# Patient Record
Sex: Female | Born: 1975 | Race: Black or African American | Hispanic: No | Marital: Married | Smoking: Former smoker
Health system: Southern US, Community
[De-identification: ages and names within clinical notes are randomized; demographics above are authoritative.]

## PROBLEM LIST (undated history)

## (undated) ENCOUNTER — Emergency Department (HOSPITAL_BASED_OUTPATIENT_CLINIC_OR_DEPARTMENT_OTHER): Admission: EM | Payer: Self-pay | Source: Ambulatory Visit

## (undated) DIAGNOSIS — R102 Pelvic and perineal pain: Secondary | ICD-10-CM

## (undated) DIAGNOSIS — D649 Anemia, unspecified: Secondary | ICD-10-CM

## (undated) DIAGNOSIS — Z789 Other specified health status: Secondary | ICD-10-CM

## (undated) HISTORY — DX: Pelvic and perineal pain: R10.2

## (undated) HISTORY — PX: TUBAL LIGATION: SHX77

## (undated) HISTORY — PX: ENDOMETRIAL ABLATION: SHX621

## (undated) HISTORY — PX: WISDOM TOOTH EXTRACTION: SHX21

---

## 2001-05-27 ENCOUNTER — Emergency Department (HOSPITAL_COMMUNITY): Admission: EM | Admit: 2001-05-27 | Discharge: 2001-05-27 | Payer: Self-pay | Admitting: Emergency Medicine

## 2001-07-04 ENCOUNTER — Other Ambulatory Visit: Admission: RE | Admit: 2001-07-04 | Discharge: 2001-07-04 | Payer: Self-pay | Admitting: Family Medicine

## 2001-08-16 ENCOUNTER — Emergency Department (HOSPITAL_COMMUNITY): Admission: EM | Admit: 2001-08-16 | Discharge: 2001-08-16 | Payer: Self-pay | Admitting: Emergency Medicine

## 2001-08-16 ENCOUNTER — Encounter: Payer: Self-pay | Admitting: Emergency Medicine

## 2001-10-30 ENCOUNTER — Encounter: Payer: Self-pay | Admitting: Family Medicine

## 2001-10-30 ENCOUNTER — Ambulatory Visit (HOSPITAL_COMMUNITY): Admission: RE | Admit: 2001-10-30 | Discharge: 2001-10-30 | Payer: Self-pay | Admitting: Family Medicine

## 2002-01-29 ENCOUNTER — Emergency Department (HOSPITAL_COMMUNITY): Admission: EM | Admit: 2002-01-29 | Discharge: 2002-01-29 | Payer: Self-pay | Admitting: Emergency Medicine

## 2002-02-06 ENCOUNTER — Encounter: Payer: Self-pay | Admitting: *Deleted

## 2002-02-06 ENCOUNTER — Emergency Department (HOSPITAL_COMMUNITY): Admission: EM | Admit: 2002-02-06 | Discharge: 2002-02-06 | Payer: Self-pay | Admitting: *Deleted

## 2002-05-22 ENCOUNTER — Ambulatory Visit (HOSPITAL_COMMUNITY): Admission: RE | Admit: 2002-05-22 | Discharge: 2002-05-22 | Payer: Self-pay | Admitting: Family Medicine

## 2002-05-22 ENCOUNTER — Encounter: Payer: Self-pay | Admitting: Orthopaedic Surgery

## 2002-08-03 ENCOUNTER — Encounter: Payer: Self-pay | Admitting: Emergency Medicine

## 2002-08-03 ENCOUNTER — Encounter: Payer: Self-pay | Admitting: Cardiology

## 2002-08-03 ENCOUNTER — Encounter: Payer: Self-pay | Admitting: Internal Medicine

## 2002-08-03 ENCOUNTER — Observation Stay (HOSPITAL_COMMUNITY): Admission: EM | Admit: 2002-08-03 | Discharge: 2002-08-05 | Payer: Self-pay | Admitting: Emergency Medicine

## 2002-11-25 ENCOUNTER — Encounter: Payer: Self-pay | Admitting: Family Medicine

## 2002-11-25 ENCOUNTER — Ambulatory Visit (HOSPITAL_COMMUNITY): Admission: RE | Admit: 2002-11-25 | Discharge: 2002-11-25 | Payer: Self-pay | Admitting: Family Medicine

## 2003-02-02 ENCOUNTER — Emergency Department (HOSPITAL_COMMUNITY): Admission: EM | Admit: 2003-02-02 | Discharge: 2003-02-02 | Payer: Self-pay | Admitting: *Deleted

## 2003-02-02 ENCOUNTER — Encounter: Admission: RE | Admit: 2003-02-02 | Discharge: 2003-05-03 | Payer: Self-pay | Admitting: Family Medicine

## 2003-02-02 ENCOUNTER — Encounter: Payer: Self-pay | Admitting: *Deleted

## 2003-02-05 ENCOUNTER — Inpatient Hospital Stay (HOSPITAL_COMMUNITY): Admission: AD | Admit: 2003-02-05 | Discharge: 2003-02-05 | Payer: Self-pay | Admitting: Obstetrics and Gynecology

## 2003-02-08 ENCOUNTER — Encounter: Payer: Self-pay | Admitting: *Deleted

## 2003-02-08 ENCOUNTER — Ambulatory Visit (HOSPITAL_COMMUNITY): Admission: RE | Admit: 2003-02-08 | Discharge: 2003-02-08 | Payer: Self-pay | Admitting: *Deleted

## 2003-03-06 ENCOUNTER — Inpatient Hospital Stay (HOSPITAL_COMMUNITY): Admission: AD | Admit: 2003-03-06 | Discharge: 2003-03-06 | Payer: Self-pay | Admitting: Obstetrics and Gynecology

## 2003-03-07 ENCOUNTER — Inpatient Hospital Stay (HOSPITAL_COMMUNITY): Admission: AD | Admit: 2003-03-07 | Discharge: 2003-03-07 | Payer: Self-pay | Admitting: Obstetrics and Gynecology

## 2003-08-12 ENCOUNTER — Ambulatory Visit (HOSPITAL_COMMUNITY): Admission: RE | Admit: 2003-08-12 | Discharge: 2003-08-12 | Payer: Self-pay | Admitting: Obstetrics and Gynecology

## 2003-08-12 ENCOUNTER — Encounter (INDEPENDENT_AMBULATORY_CARE_PROVIDER_SITE_OTHER): Payer: Self-pay | Admitting: Specialist

## 2003-08-31 ENCOUNTER — Emergency Department (HOSPITAL_COMMUNITY): Admission: EM | Admit: 2003-08-31 | Discharge: 2003-09-01 | Payer: Self-pay | Admitting: Emergency Medicine

## 2003-09-27 ENCOUNTER — Emergency Department (HOSPITAL_COMMUNITY): Admission: EM | Admit: 2003-09-27 | Discharge: 2003-09-27 | Payer: Self-pay | Admitting: Emergency Medicine

## 2003-09-27 ENCOUNTER — Encounter: Payer: Self-pay | Admitting: Emergency Medicine

## 2003-10-18 ENCOUNTER — Encounter: Payer: Self-pay | Admitting: Emergency Medicine

## 2003-10-18 ENCOUNTER — Emergency Department (HOSPITAL_COMMUNITY): Admission: EM | Admit: 2003-10-18 | Discharge: 2003-10-18 | Payer: Self-pay | Admitting: Emergency Medicine

## 2003-10-20 ENCOUNTER — Other Ambulatory Visit: Admission: RE | Admit: 2003-10-20 | Discharge: 2003-10-20 | Payer: Self-pay | Admitting: Obstetrics and Gynecology

## 2003-11-21 ENCOUNTER — Emergency Department (HOSPITAL_COMMUNITY): Admission: EM | Admit: 2003-11-21 | Discharge: 2003-11-21 | Payer: Self-pay | Admitting: *Deleted

## 2003-11-26 ENCOUNTER — Ambulatory Visit (HOSPITAL_COMMUNITY): Admission: RE | Admit: 2003-11-26 | Discharge: 2003-11-26 | Payer: Self-pay | Admitting: Orthopaedic Surgery

## 2004-01-14 ENCOUNTER — Ambulatory Visit (HOSPITAL_COMMUNITY): Admission: RE | Admit: 2004-01-14 | Discharge: 2004-01-14 | Payer: Self-pay | Admitting: Family Medicine

## 2004-04-22 ENCOUNTER — Emergency Department (HOSPITAL_COMMUNITY): Admission: EM | Admit: 2004-04-22 | Discharge: 2004-04-22 | Payer: Self-pay | Admitting: Emergency Medicine

## 2005-02-02 ENCOUNTER — Inpatient Hospital Stay (HOSPITAL_COMMUNITY): Admission: EM | Admit: 2005-02-02 | Discharge: 2005-02-05 | Payer: Self-pay | Admitting: *Deleted

## 2006-04-19 ENCOUNTER — Emergency Department (HOSPITAL_COMMUNITY): Admission: EM | Admit: 2006-04-19 | Discharge: 2006-04-19 | Payer: Self-pay | Admitting: Emergency Medicine

## 2006-05-02 ENCOUNTER — Emergency Department (HOSPITAL_COMMUNITY): Admission: EM | Admit: 2006-05-02 | Discharge: 2006-05-02 | Payer: Self-pay | Admitting: Family Medicine

## 2006-12-04 ENCOUNTER — Inpatient Hospital Stay (HOSPITAL_COMMUNITY): Admission: AD | Admit: 2006-12-04 | Discharge: 2006-12-04 | Payer: Self-pay | Admitting: Gynecology

## 2007-09-19 ENCOUNTER — Emergency Department (HOSPITAL_COMMUNITY): Admission: EM | Admit: 2007-09-19 | Discharge: 2007-09-19 | Payer: Self-pay | Admitting: Family Medicine

## 2008-11-14 ENCOUNTER — Emergency Department (HOSPITAL_COMMUNITY): Admission: EM | Admit: 2008-11-14 | Discharge: 2008-11-15 | Payer: Self-pay | Admitting: Emergency Medicine

## 2008-12-28 ENCOUNTER — Emergency Department (HOSPITAL_COMMUNITY): Admission: EM | Admit: 2008-12-28 | Discharge: 2008-12-28 | Payer: Self-pay | Admitting: Family Medicine

## 2009-10-27 ENCOUNTER — Encounter: Payer: Self-pay | Admitting: Family Medicine

## 2009-12-07 ENCOUNTER — Emergency Department (HOSPITAL_COMMUNITY): Admission: EM | Admit: 2009-12-07 | Discharge: 2009-12-07 | Payer: Self-pay | Admitting: Family Medicine

## 2010-05-03 ENCOUNTER — Observation Stay (HOSPITAL_COMMUNITY): Admission: EM | Admit: 2010-05-03 | Discharge: 2010-05-04 | Payer: Self-pay | Admitting: Emergency Medicine

## 2010-05-03 ENCOUNTER — Ambulatory Visit: Payer: Self-pay | Admitting: Internal Medicine

## 2010-05-04 ENCOUNTER — Encounter (INDEPENDENT_AMBULATORY_CARE_PROVIDER_SITE_OTHER): Payer: Self-pay | Admitting: Emergency Medicine

## 2010-12-29 ENCOUNTER — Emergency Department (HOSPITAL_COMMUNITY)
Admission: EM | Admit: 2010-12-29 | Discharge: 2010-12-29 | Payer: Self-pay | Source: Home / Self Care | Admitting: Emergency Medicine

## 2011-01-23 ENCOUNTER — Emergency Department (HOSPITAL_COMMUNITY): Admission: EM | Admit: 2011-01-23 | Discharge: 2011-01-23 | Payer: Self-pay | Source: Home / Self Care

## 2011-01-23 ENCOUNTER — Emergency Department (HOSPITAL_BASED_OUTPATIENT_CLINIC_OR_DEPARTMENT_OTHER)
Admission: EM | Admit: 2011-01-23 | Discharge: 2011-01-23 | Payer: Self-pay | Source: Home / Self Care | Admitting: Emergency Medicine

## 2011-01-23 ENCOUNTER — Other Ambulatory Visit: Payer: Self-pay | Admitting: Family Medicine

## 2011-01-23 ENCOUNTER — Emergency Department (HOSPITAL_COMMUNITY)
Admission: EM | Admit: 2011-01-23 | Discharge: 2011-01-23 | Payer: Self-pay | Source: Home / Self Care | Admitting: Emergency Medicine

## 2011-01-23 ENCOUNTER — Emergency Department (HOSPITAL_BASED_OUTPATIENT_CLINIC_OR_DEPARTMENT_OTHER)
Admission: EM | Admit: 2011-01-23 | Discharge: 2011-01-24 | Disposition: A | Discharge status: Home / Self Care | Payer: Self-pay | Attending: Emergency Medicine | Admitting: Emergency Medicine

## 2011-01-23 DIAGNOSIS — K8020 Calculus of gallbladder without cholecystitis without obstruction: Secondary | ICD-10-CM

## 2011-01-23 LAB — COMPREHENSIVE METABOLIC PANEL
AST: 19 U/L (ref 0–37)
Alkaline Phosphatase: 56 U/L (ref 39–117)
Alkaline Phosphatase: 81 U/L (ref 39–117)
BUN: 11 mg/dL (ref 6–23)
BUN: 14 mg/dL (ref 6–23)
Chloride: 107 mEq/L (ref 96–112)
GFR calc Af Amer: >60 mL/min (ref >60)
GFR calc Af Amer: >60 mL/min (ref >60)
Sodium: 142 mEq/L (ref 135–145)
Total Bilirubin: 0.4 mg/dL (ref 0.3–1.2)
Total Protein: 7.4 g/dL (ref 6.0–8.3)
Total Protein: 8.4 g/dL — ABNORMAL HIGH (ref 6.0–8.3)

## 2011-01-23 LAB — DIFFERENTIAL
Eosinophils Relative: 4 % (ref 0–5)
Lymphocytes Relative: 51 % — ABNORMAL HIGH (ref 12–46)
Monocytes Relative: 10 % (ref 3–12)
Neutrophils Relative %: 35 % — ABNORMAL LOW (ref 43–77)

## 2011-01-23 LAB — LIPASE, BLOOD
Lipase: 32 U/L (ref 11–59)
Lipase: 120 U/L (ref 23–300)

## 2011-01-23 LAB — URINE MICROSCOPIC-ADD ON

## 2011-01-23 LAB — CBC
HCT: 30.8 % — ABNORMAL LOW (ref 36.0–46.0)
HCT: 30.7 % — ABNORMAL LOW (ref 36.0–46.0)
Hemoglobin: 9.3 g/dL — ABNORMAL LOW (ref 12.0–15.0)
MCH: 21.1 pg — ABNORMAL LOW (ref 26.0–34.0)
MCH: 21.1 pg — ABNORMAL LOW (ref 26.0–34.0)
MCHC: 30.2 g/dL (ref 30.0–36.0)
MCV: 70 fL — ABNORMAL LOW (ref 78.0–100.0)
MCV: 67.3 fL — ABNORMAL LOW (ref 78.0–100.0)
Platelets: 280 10*3/uL (ref 150–400)
Platelets: 299 10*3/uL (ref 150–400)
RBC: 4.4 MIL/uL (ref 3.87–5.11)
RDW: 17.6 % — ABNORMAL HIGH (ref 11.5–15.5)
WBC: 7.6 10*3/uL (ref 4.0–10.5)
WBC: 8.4 10*3/uL (ref 4.0–10.5)

## 2011-01-23 LAB — URINALYSIS, ROUTINE W REFLEX MICROSCOPIC
Hgb urine dipstick: NEGATIVE
Ketones, ur: NEGATIVE mg/dL
Protein, ur: NEGATIVE mg/dL
Urine Glucose, Fasting: NEGATIVE mg/dL
Urobilinogen, UA: 0.2 mg/dL (ref 0.0–1.0)

## 2011-01-23 LAB — POCT URINALYSIS DIPSTICK
Bilirubin Urine: NEGATIVE
Nitrite: NEGATIVE
Protein, ur: NEGATIVE mg/dL
pH: 6 (ref 5.0–8.0)

## 2011-01-23 LAB — POCT PREGNANCY, URINE: Preg Test, Ur: NEGATIVE

## 2011-01-23 LAB — AMYLASE: Amylase: 88 U/L (ref 0–105)

## 2011-01-23 LAB — PREGNANCY, URINE: Preg Test, Ur: NEGATIVE

## 2011-01-24 ENCOUNTER — Other Ambulatory Visit (HOSPITAL_BASED_OUTPATIENT_CLINIC_OR_DEPARTMENT_OTHER): Payer: Self-pay | Admitting: Emergency Medicine

## 2011-01-24 ENCOUNTER — Emergency Department (HOSPITAL_BASED_OUTPATIENT_CLINIC_OR_DEPARTMENT_OTHER)
Admit: 2011-01-24 | Discharge: 2011-01-24 | Disposition: A | Discharge status: Home / Self Care | Payer: Self-pay | Attending: Emergency Medicine | Admitting: Emergency Medicine

## 2011-01-24 ENCOUNTER — Encounter (HOSPITAL_BASED_OUTPATIENT_CLINIC_OR_DEPARTMENT_OTHER): Payer: Self-pay

## 2011-01-24 DIAGNOSIS — Z711 Person with feared health complaint in whom no diagnosis is made: Secondary | ICD-10-CM

## 2011-01-24 DIAGNOSIS — R109 Unspecified abdominal pain: Secondary | ICD-10-CM | POA: Insufficient documentation

## 2011-01-24 DIAGNOSIS — R10819 Abdominal tenderness, unspecified site: Secondary | ICD-10-CM | POA: Insufficient documentation

## 2011-01-25 ENCOUNTER — Inpatient Hospital Stay (HOSPITAL_COMMUNITY): Admission: RE | Admit: 2011-01-25 | Payer: Self-pay | Source: Ambulatory Visit

## 2011-03-13 LAB — POCT CARDIAC MARKERS
CKMB, poc: <1 ng/mL — ABNORMAL LOW (ref 1.0–8.0)
CKMB, poc: <1 ng/mL — ABNORMAL LOW (ref 1.0–8.0)
Myoglobin, poc: 62.4 ng/mL (ref 12–200)
Myoglobin, poc: 70.8 ng/mL (ref 12–200)
Troponin i, poc: <0.05 ng/mL (ref 0.00–0.09)
Troponin i, poc: <0.05 ng/mL (ref 0.00–0.09)
Troponin i, poc: <0.05 ng/mL (ref 0.00–0.09)

## 2011-03-13 LAB — BASIC METABOLIC PANEL
BUN: 6 mg/dL (ref 6–23)
Chloride: 106 mEq/L (ref 96–112)
Creatinine, Ser: 0.68 mg/dL (ref 0.4–1.2)
GFR calc Af Amer: >60 mL/min (ref >60)
GFR calc non Af Amer: >60 mL/min (ref >60)

## 2011-03-13 LAB — CBC
Platelets: 285 10*3/uL (ref 150–400)
RBC: 3.88 MIL/uL (ref 3.87–5.11)
WBC: 10.2 10*3/uL (ref 4.0–10.5)

## 2011-03-13 LAB — DIFFERENTIAL
Lymphocytes Relative: 26 % (ref 12–46)
Lymphs Abs: 2.7 10*3/uL (ref 0.7–4.0)
Monocytes Relative: 9 % (ref 3–12)
Neutro Abs: 6.2 10*3/uL (ref 1.7–7.7)
Neutrophils Relative %: 61 % (ref 43–77)

## 2011-05-11 NOTE — Discharge Summary (Signed)
NAMEKURT, AZIMI NO.:  [id number]   MEDICAL RECORD NO.:  [id number]          PATIENT TYPE:  INP   LOCATION:  5028                         FACILITY:  [facility]   PHYSICIAN:  Kela Millin, M.D.DATE OF BIRTH:  Dec 04, 1976   DATE OF ADMISSION:  02/02/2005  DATE OF DISCHARGE:  02/05/2005                           DISCHARGE SUMMARY - REFERRING   DISCHARGE DIAGNOSES:  1.  Right upper lobe pneumonia.  2.  Volume depletion.  3.  Microcytic anemia - secondary to menorrhagia.  4.  Question Tamiflu/Zithromax intolerance.  5.  Volume depletion.  6.  Hypokalemia.   PROCEDURES AND STUDIES:  1.  Lumbar puncture - CSF showed 1 wbc, no organisms and no bacteria.  2.  CT angiogram of chest - dense right upper lobe pneumonia and mild right      lower lobe pneumonia.  No pulmonary emboli.  3.  CT scan of head - normal examination.  4.  CT scan of sinuses - normal examination.   HISTORY:  The patient is a 35 year old black female who indicated on  presentation that about two days prior, she began experiencing an onset of  pressure in her frontal and maxillary sinuses associated with sinus  congestion.  At that time, she did not have any fevers.  She took an over-  the-counter cold medication that seemed to relieve her congestion somewhat.  The next day, she began to experience chills, generalized myalgias and  fevers.  She presented to Dr. Julian Reil at the [facility]  who suspected that this had influenza - a rapid flu test was done which was  negative and she was empirically started on Tamiflu and Zithromax.  Unfortunately, these medications seemed to have triggered significant GI  upset and she experienced vomiting and diarrhea that night.  On the third  day, she began to notice that she was having increased severe frontal  headaches associated with worsening neck stiffness. The noticed a mild dry  nonproductive cough and by that time, all of her GI  symptoms had resolved.  The patient returned to see Dr. Kevan Ny who obtained white cell count and it  was elevated at 36,000 with a left shift.  This triggered concern for  possible bacterial meningitis, so the patient was referred to [facility] for admission.  The patient denied associated rash, ear  aches, dysuria or urinary frequency.  Of note, was the fact that the patient  had been found on several occasions to be chronically anemic and it was  presumably secondary to menorrhagia.  The patient was on iron supplements  and she stated that she had started taking this about a week prior to  presentation.   PHYSICAL EXAMINATION UPON ADMISSION:  As per Dr. Tresa Endo revealed:  GENERAL APPEARANCE:  She was obese and was complaining of a diffuse headache  and had a very stiff  neck.  HEENT:  No photophobia, tympanic membranes were clear and her nares were  patent.  Pharynx with no erythema or exudates.  NECK:  Extremely stiff, patient resisting efforts to flex her neck and she  was unable to spontaneously touch her chin to her chest.  LUNGS:  Clear to auscultation as well as the rest of her physical  examination.   LABORATORY DATA:  Her urinalysis was negative for infection.  Her white cell  count 32.6 with hemoglobin of 8.1, hematocrit 25.3, platelet count 386;  neutrophil count 74%.  Her sodium was 129 with a potassium of 2.9, chloride  98, CO2 24, BUN 10, glucose 102, creatinine 1.  AST 43.  The rest of her  LFTs were within normal limits.   Chest x-ray - Right upper lobe pneumonia.   HOSPITAL COURSE:  PROBLEM #1 -  RIGHT UPPER LOBE PNEUMONIA/LEUKOCYTOSIS:  Upon admission, the patient had a lumbar puncture done and the results are  stated above.  The CSF glucose as well as protein were within normal limits  as well.  Prior to obtaining these results the patient was empirically  started on IV antibiotics to cover for possible meningitis and a chest x-ray  was done  and the results are as stated above.  A urinalysis was also done  and it was negative for infection.  The patient had blood cultures done upon  admission as well and this did not grow bacteria.  After getting back the  results of her CSF fluid, it was noted that the patient did not have  meningitis, so the antibiotics were adjusted accordingly, the patient was  then treated with Avelox and antitussive added as well.  The patient  responded well to these interventions, remained afebrile during her hospital  stay, was hemodynamically stable and also her white cell count gradually  resolved.  Her last wbc count prior to discharge is 11, her cough is  decreased.  She is tolerating p.o. well ad will be discharged on oral  antibiotics at this time to follow up with her primary care physician.   PROBLEM #2 -  MICROCYTIC ANEMIA:  As noted in the history, the patient had a  prior history of anemia and felt to be secondary to menorrhagia and was on  iron tablets.  Following her hospitalization, her hemoglobin dropped to 87.1  and she was symptomatic, so she was transfused two units of packed red blood  cells.  Prior to the transfusion, she had iron studies done and her serum  iron was 21 with a folate of 11.2, vitamin B12 871, ferritin 31,  reticulocyte count 2.1.  The patient's H&H remained stable - 9.5 and 29.1  post transfusion, symptoms resolved, she will be discharged on iron and she  is to follow up with her primary care physician.   PROBLEM #3 -  VOLUME DEPLETION:  Patient was hyponatremic upon admission and  following hydration, the hyponatremia resolved.  Her sodium prior to  discharge is 140.   PROBLEM #4 -  HYPOKALEMIA:  The patient's potassium was replaced during her  hospital stay.   DISCHARGE MEDICATIONS:  1.  Avelox 400 mg one p.o. daily x 10 days.  2.  Iron sulfate 325 mg p.o. t.i.d.  3.  Senokot two p.o. q.h.s. p.r.n. constipation.  4.  Tylenol p.r.n.  5.  Humibid DM one p.o.  b.i.d. for cough.   FOLLOW UP:  Primary care physician in one week.   CONDITION ON DISCHARGE:  Improved/stable.      ACV/MEDQ  D:  02/05/2005  T:  02/05/2005  Job:  [id number]   cc:   Duncan Dull, M.D.  [address]  [place]  [place] [address]  Fax: [phone]

## 2011-05-11 NOTE — Op Note (Signed)
   NAME:  Renee [name], Renee [name] NO.:  [id number]   MEDICAL RECORD NO.:  [id number]                   PATIENT TYPE:  AMB   LOCATION:  SDC                                  FACILITY:  [facility]   PHYSICIAN:  Crist Fat. Rivard, M.D.              DATE OF BIRTH:  09/21/1976   DATE OF PROCEDURE:  08/12/2003  DATE OF DISCHARGE:                                 OPERATIVE REPORT   PREOPERATIVE DIAGNOSIS:  Menometrorrhagia.   POSTOPERATIVE DIAGNOSIS:  Menometrorrhagia.   ANESTHESIA:  General.   PROCEDURE:  Hysteroscopy with dilatation and curettage.   SURGEON:  Crist Fat. Rivard, M.D.   ESTIMATED BLOOD LOSS:  Minimal.   DESCRIPTION OF PROCEDURE:  After being informed of the planned procedure  with possible complications including bleeding, infection, uterine  perforation with need for laparoscopy or laparotomy, informed consent was  obtained.  The patient was taken to OR #2, given general anesthesia with  endotracheal intubation without any complication, and placed in the  lithotomy position.  She was prepped and draped in a sterile fashion, and  her bladder was emptied with an in-and-out Foley catheter.   GYN exam revealed an anteverted uterus, normal in size and shape, and two  adnexa without concern.  A weighted speculum was inserted, anterior lip of  the cervix was grasped with a tenaculum forceps, and the uterus was sounded  at 9.5 cm.  We then proceeded with dilation of the cervix using Hegar  dilator at #27, which allowed easy entry of the diagnostic hysteroscope.  We  then proceeded with uterine perfusion with sorbitol at 90 mmHg to allow  complete visualization of the endometrial cavity, which revealed an overall  diffuse thickened endometrium with a polypoid aspect, no identified polyp  per se, but tubal ostia were visualized and normal.  No signs of submucosal  fibroid.  We removed the diagnostic hysteroscope, dilated the cervix at #31,  and proceeded with a  sharp curettage of all uterine walls, which removed a  large amount of normal-appearing endometrium with the possibilities of  multiple small polyps.  After the curettage was completed, the hysteroscope  was reinserted to visualize the cavity, which now revealed a very normal  cavity with no submucosal fibroid.   Instruments were then removed, instrument and sponge count complete,  hemostasis adequate.  Estimated blood loss was minimal.  The patient is very  well-tolerated by patient, who is taken to the recovery room in a well and  stable condition.                                               Crist Fat Rivard, M.D.    SAR/MEDQ  D:  08/12/2003  T:  08/13/2003  Job:  [id number]

## 2011-05-11 NOTE — Procedures (Signed)
   NAME:  Renee [name], Renee [name] NO.:  [id number]   MEDICAL RECORD NO.:  [id number]                   PATIENT TYPE:  OBV   LOCATION:  A223                                 FACILITY:  [facility]   PHYSICIAN:  Fredirick Maudlin, M.D.              DATE OF BIRTH:  11-Dec-1976   DATE OF PROCEDURE:  DATE OF DISCHARGE:  08/05/2002                                EKG INTERPRETATION   INTERPRETATION:  The rhythm was sinus rhythm with a rate in the 70s.   DIAGNOSIS:  Normal EKG.                                               Fredirick Maudlin, M.D.    ELH/MEDQ  D:  08/03/2002  T:  08/06/2002  Job:  [id number]

## 2011-05-11 NOTE — Procedures (Signed)
   NAME:  Renee [name], Renee [name] NO.:  [id number]   MEDICAL RECORD NO.:  [id number]                   PATIENT TYPE:  OBV   LOCATION:  A223                                 FACILITY:  [facility]   PHYSICIAN:  Luis Abed, M.D. [facility]           DATE OF BIRTH:  [birth year]   DATE OF PROCEDURE:  08/04/2002  DATE OF DISCHARGE:  08/05/2002                                    STRESS TEST   BRIEF HISTORY:  The patient is a 35 year old female admitted to [facility] on August 03, 2002 for evaluation of chest pain.  She was noted to  have an elevated D-dimer.  A chest CT was negative for pulmonary embolus.  She had a 2-D echocardiogram performed on the 12th that showed an ejection  fraction of 60% and was interpreted as a normal 2-D echocardiogram.   Cardiac risk factors are significant for tobacco use and a strong family  history of coronary artery disease.   Prior to the study today the patient had no complaints of chest pain or  shortness of breath.  Her EKG showed sinus rhythm, rate 70 beats per minute.  There were no ischemic changes.  Blood pressure was 116/72.  Target heart  rate 164.   The patient was able to exercise for a total of six minutes 25 seconds  reaching a maximum heart rate of 165 beats per minute.  She was injected at  five minutes into the study at which time her heart rate was 160.  She did  not have any chest pain.  There were no EKG changes.  The test was concluded  secondary to patient fatigue.  The images are pending at time of this  dictation.     Delton See, PA [facility]                    Luis Abed, M.D. [facility]    DR/MEDQ  D:  08/04/2002  T:  08/10/2002  Job:  [id number]   cc:   Elliot Cousin, M.D.

## 2011-05-11 NOTE — H&P (Signed)
NAME:  Renee, [name] NO.:  [id number]   MEDICAL RECORD NO.:  [id number]          PATIENT TYPE:  EMS   LOCATION:  MAJO                         FACILITY:  [facility]   PHYSICIAN:  Sherin Quarry, MD      DATE OF BIRTH:  10/01/1976   DATE OF ADMISSION:  02/02/2005  DATE OF DISCHARGE:                                HISTORY & PHYSICAL   Renee [name] is a 35 year old lady who indicates that on Wednesday of  this week, i.e. 2 days ago she experienced the onset of pressure in her  frontal and maxillary sinuses associated with sinus congestion.  At that  time she was having no fever.  She took an over-the-counter cold medicine  which seemed to relieve her congestion somewhat.  Then on Thursday, she  began to experience chills, generalized myalgia, and fevers.  She presented  to Dr. Julian Reil at [facility] who suspected that she had  influenza.  A rapid flu test was done which was negative and she was a  treated empirically with Tamiflu and Zithromax.  Unfortunately, these  medications seem to have triggered significant gastrointestinal upset and  she experienced vomiting and diarrhea on Thursday night. On Friday, she  began to notice that she was having an increasingly severe frontal headache  associated with worsening neck stiffness.  She noticed a mild, dry,  nonproductive cough.  By that point, all of her gastrointestinal symptoms  had resolved.  She returned to see Dr. Kevan Ny who obtained a white count  which was 36,000 with a left shift.  This triggered concern about a possible  bacterial meningitis or bacterial sepsis and therefore, the patient was  referred to [facility] for admission.  She denies any associated rash,  earache, dysuria, or urinary frequency.  Of note, is that the patient has  been found on several occasions to be chronically anemic, presumably  secondary to menorrhagia.  She was told to take iron, and she indicates that  she did in  fact start taking one iron tablet twice daily about a week ago.   PAST MEDICAL HISTORY:   MEDICATIONS:  The only medication she seems to be taking right now, is the  iron pill as mentioned above.   ALLERGIES:  She has no known drug allergies, although presumably she is  intolerant of either ZITHROMAX or TAMIFLU.   OPERATIONS:  She has had a previous hysteroscopy, as part of a gynecologic  evaluation.   She does not recall any other serious illnesses.   FAMILY HISTORY:  Both of her parents died of heart disease.  She has a  brother who has diabetes, hypertension and heart disease.  She has a sister  who is in good health.   SOCIAL HISTORY:  She does not smoke.  She denies use of alcohol.  She does  not abuse drugs.   REVIEW OF SYSTEMS:  HEAD: See above. She denies photophobia, visual  blurring, or diplopia.  Ear, nose and throat:  She denies earache, sinus  pain, or sore throat.  CHEST:  See above.  There has been no chest  pain.  CARDIOVASCULAR:  She denies orthopnea, PND, or ankle edema.  GI:  See above.  As noted previously, she is not currently experiencing any vomiting,  abdominal pain, change in bowel habits, melena, or hematochezia.  GU:  She  denies dysuria or urinary frequency.  GYN:  Her last menstrual period was 3  weeks ago.  She has not been having any unusual vaginal discharge or  bleeding.  NEURO:  There is no history of seizure or stroke.  ENDOCRINE:  She denies excessive thirst, urinary frequency, or nocturia.   PHYSICAL EXAM:  GENERAL:  She is an obese lady who complains of a diffuse  headache and has a very stiff neck.  HEENT:  The pupils were equal and reactive. There is no photophobia.  Tympanic membranes were clear. Nares is patent. Pharynx is without erythema  or exudate. There is no lymphadenopathy or thyromegaly.  NECK:  Extremely stiff.  The patient resists efforts to flex her neck.  She  is unable to spontaneously touch her chin to her chest.  CHEST:   Clear.  BACK:  Reveals no CVA or point tenderness.  CARDIOVASCULAR:  Reveals normal S1-S2.  There are no rubs, murmurs, or  gallops.  ABDOMEN:  Benign.  Normal bowel sounds. There are no masses or tenderness.  No guarding or rebound.  NEUROLOGIC:  On neurologic testing, cranial nerves, motor, sensory, and  cerebellar testing is normal. Station and gait were not tested.  EXTREMITIES:  Reveals no rash.  There is no ecchymosis.  There is no  cyanosis or edema   IMPRESSION:  1.  Acute febrile illness characterized by myalgia, headache, and a very      stiff neck, as well as marked elevation of white blood cell count.  Need      to rule out meningitis, sepsis, pneumonia, etc.  2.  Chronic anemia attributed to menorrhagia and iron deficiency.  3.  History of hysteroscopy.  4.  Intolerance of Tamiflu and Zithromax.   PLAN:  1.  Will evaluate this patient's problem very carefully.  2.  Will obtain blood cultures, urine cultures, chest x-ray.  We will obtain      CT scan of the brain and include sinus x-rays to make sure there are no      signs of frontal or maxillary sinusitis particularly in light of the      patient's presentation.  3.  A lumbar puncture will be performed under fluoroscopic guidance to      obtain a spinal fluid which will be sent for cell count, differential,      Gram stain, routine culture, protein, and glucose.  After these studies      have been obtained, I am going to empirically put her on vancomycin 1      gram IV every 12 hours and Rocephin 2 grams every 12 hours to cover for      the possibility of meningitis or sepsis, pending of the results of these      studies.  I think this would be the most prudent approach to take.      Antibiotic therapy can always be discontinued if the patient seems to      have findings consistent with a viral illness.  4.  We will follow her closely in the hospital setting.     SY/MEDQ  D:  02/02/2005  T:  02/03/2005  Job:   [id number]   cc:   Duncan Dull, M.D.  [phone]  [place]  [place] [address]  Fax: [phone]

## 2011-05-11 NOTE — H&P (Signed)
NAME:  Renee [name] NO.:  [id number]   MEDICAL RECORD NO.:  [id number]                   PATIENT TYPE:  OBV   LOCATION:  A223                                 FACILITY:  [facility]   PHYSICIAN:  Elliot Cousin, M.D.                 DATE OF BIRTH:  [birth year]   DATE OF ADMISSION:  08/03/2002  DATE OF DISCHARGE:                                HISTORY & PHYSICAL   CHIEF COMPLAINT:  Chest pain.   HISTORY OF PRESENT ILLNESS:  [name] is a 35 year old African-American  woman with a past medical history significant for depression, anxiety, and  gastroesophageal reflux disease who presented to the emergency department  this morning with a 1-2 hour episode of left sided chest pain. The patient  states that she had one episode of left sided chest pain last night which  last approximately five minutes and then went away. The chest pain last  night occurred as she was bending over to sit down. It had a sharp stabbing  nature to it. It was 5/10 in intensity. It was accompanied by a sensation of  palpitations. She had no associated nausea, diaphoresis, light headedness,  radiation, pleurisy, or shortness of breath. The pain returned this morning  while she was driving on her way to work. The intensity was 9/10. It was  described as sharp and stabbing. No associated symptoms just as there were  no associated symptoms the night before with the exception of a sensation of  palpitations. Her husband drover her to the emergency department, she was  given sublingual nitroglycerin x1 and three baby aspirin. The pain totally  resolved after one sublingual nitroglycerin.   The patient has had no recent cough, pleurisy or upper respiratory infection  symptoms. She has had no heavy lifting. She has had some anxiety recently.  She states that the pain tends to be a positional, worse when she moves a  certain way.   REVIEW OF SYMPTOMS:  Negative as well for fever, chills,  headache, cough,  wheezing, changes in her weight, nausea, vomiting, diarrhea, blood in her  stools, black tarry stools, blood in her urine, pain when she urinates, and  arthritic pain.   The review of systems is positive for fatigue, occasion constipation. The  patient's last menstrual period was July 07, 2002.   PAST MEDICAL HISTORY:  1. Chronic microcytic anemia (hemoglobin and hematocrit November 2002 were     9.8 and 32.0 respectively with an MCV of 74.4).  2. Gastroesophageal reflux disease.  3. History of depression (the patient stopped Zoloft approximately two     months).  4. Chronic anxiety.  5. Insomnia.  6. Heavy menstrual period and dysmenorrhea.  7. Status post bilateral tubal ligation in 1997.   MEDICATIONS:  1. Nexium 40 mg q.d.  2. Zoloft 50 mg q.d. (patient not taking).  3. Xanax 0.25 mg q.h.s. p.r.n.  4. Motrin  over the counter as needed for menstrual cramps.  5. Excedrin over the counter as needed for menstrual cramps.   ALLERGIES:  No known drug allergies.   FAMILY HISTORY:  Her father is deceased, he died at 63 years of age  apparently secondary to a myocardial infarction during heart surgery. Her  mother is also deceased at the age of 25 years old secondary to coronary  artery disease. Her brother is 12 years old and has coronary artery disease  and type 2 diabetes mellitus.   SOCIAL HISTORY:  The patient is married. She is employed. She has two  children. She denies tobacco, alcohol or drug use. She completed the twelfth  grade in school. She can read and write.   PHYSICAL EXAMINATION:  VITAL SIGNS:  Taken in the emergency room revealed temperature of 99.2,  pulse 85, respiratory rate 18, blood pressure 141/81, oxygen saturation 100%  on room air.   GENERAL:  Currently the patient is sitting up in bed in no acute distress.  She is an alert, pleasant, 35 year old, obese African-American woman.   HEENT:  Head is normocephalic, atraumatic. Pupils  equal round and reactive  to light. Extraocular movements intact. Conjunctiva are clear. Sclerae are  white. No proptosis or ptosis noted. Oropharynx reveals moist mucous  membranes, no exudate or erythema. Nasal mucosa is moist, no drainage.   NECK:  Supple, mildly obese.  Question subtle thyromegaly. No JVD.   LUNGS:  Clear to auscultation bilaterally.   HEART:  S1, S2 with a 2/6 systolic murmur. Chest wall mildly tender over the  left chest wall just above the mammary tissue. No edema, erythema or  bruising of the left chest wall.   ABDOMEN:  Obese, positive bowel sounds, soft, nontender, nondistended. No  hepatosplenomegaly. Bowel sounds are present. No masses.   BREASTS:  Deferred.   GU:  Deferred.   RECTAL:  The patient has good rectal bone. No rectal lesions palpated or  seen. Scant amount of stool in the rectal vault. Stool is light brown and  guaiac negative.   EXTREMITIES/MUSCULOSKELETAL:  The patient has good muscle tone and strength  throughout. No joint abnormalities. Pedal pulses are 2+ bilaterally. No  pretibial edema.   NEUROLOGIC:  The patient is alert and oriented x3. Cranial nerves II-XII are  intact. Gait and strength are within normal limits. No focal neurologic  abnormalities.   PSYCHOLOGIC:  The patient was alert and oriented x3. She ha so signs or  symptoms consistent with anxiety or depression. Her affect is pleasant.  Speech appropriate.   INITIAL LABORATORIES:  EKG normal sinus rhythm with no acute changes. Chest  x-ray borderline cardiac size otherwise no active chest disease  radiographically. ABG on room air pH 7.4, pCO2 36, PO2 92, WBC 6.0,  hemoglobin 8.1, hematocrit 26.2, MCV 60.6, sodium 138, potassium 4.1,  chloride 109, CO2 27, glucose 97, BUN 10, creatinine 0.8, calcium 8.6. D-  dimer 1.0 (0-0.48 normal range). Lipase 26, CK 93, CK-MB 1.0, troponin I  less than 0.01.   ASSESSMENT: 1. Chest pain. Given the patient's significant family  history, the patient     will be admitted/observed at least 24-48 hours to rule out a myocardial     infarction. The symptoms appear to be atypical for angina, however.  2. Microcytic anemia. The patient's hemoglobin and hematocrit are below the     usual 9.5 to 9.8 range. The patient denies any rectal bleeding, however,     she does have  a history of heavy menstrual periods.  3. Elevated D-dimer.   PLAN:  1. Will admit the patient to a telemetry bed for 24-48 hours to rule out     myocardial infarction.  2. Treatment was started in the emergency department when the patient was     given one sublingual nitroglycerin and three baby aspirin.  3. Will continue sublingual nitroglycerin 0.4 mg sublingual q. 5 minutes x     15 minutes p.r.n. for chest pain. Will also add Vicodin 5 mg q. 4h p.r.n.     for pain.  4. Continue baby aspirin at 81. mg q.d.  5. Continue Protonix 40 mg q.d.  6. Obtain urine drug screen, troponin I/CK/CK-MB q. 8h x3. Will also assess     thyroid function by obtaining a free T4 and a TSH.  7. Obtain a 2-D echocardiogram for evaluation of chest pain.  8. Consider a CT scan of the chest to rule out pulmonary embolism; however,     this is unlikely considering the patient's pulse ox of 100% on room air.  9. Obtain a fasting lipid panel in the a.m.  Repeat EKG in the a.m.  10.      Type and cross 2 units of packed red blood cells and transfuse each     unit over three hours.  11.      Obtain iron studies.  12.      Obtain a cardiology consult.   ADDENDUM:  The results of the 2-D echocardiogram preliminary finding per Dr.  Daleen Squibb revealed a normal echo. In addition, a CT scan of the chest as ordered  by Dr. Daleen Squibb revealed no pulmonary embolism, lungs clear, no acute  abnormalities.                                               Elliot Cousin, M.D.    DF/MEDQ  D:  08/03/2002  T:  08/03/2002  Job:  [id number]

## 2011-06-02 ENCOUNTER — Inpatient Hospital Stay (INDEPENDENT_AMBULATORY_CARE_PROVIDER_SITE_OTHER)
Admission: RE | Admit: 2011-06-02 | Discharge: 2011-06-02 | Disposition: A | Discharge status: Home / Self Care | Payer: Self-pay | Source: Ambulatory Visit | Attending: Emergency Medicine | Admitting: Emergency Medicine

## 2011-06-02 DIAGNOSIS — M79609 Pain in unspecified limb: Secondary | ICD-10-CM

## 2011-09-25 LAB — POCT PREGNANCY, URINE: Preg Test, Ur: NEGATIVE

## 2011-09-25 LAB — GC/CHLAMYDIA PROBE AMP, GENITAL
Chlamydia, DNA Probe: NEGATIVE
GC Probe Amp, Genital: NEGATIVE

## 2011-09-25 LAB — DIFFERENTIAL
Basophils Absolute: 0.1
Basophils Relative: 1
Lymphocytes Relative: 28
Monocytes Relative: 6
Neutro Abs: 6.1

## 2011-09-25 LAB — POCT I-STAT, CHEM 8
BUN: 11
Creatinine, Ser: 0.9
Hemoglobin: 11.6 — ABNORMAL LOW
Potassium: 3.6
Sodium: 141

## 2011-09-25 LAB — WET PREP, GENITAL
WBC, Wet Prep HPF POC: NONE SEEN
Yeast Wet Prep HPF POC: NONE SEEN

## 2011-09-25 LAB — CBC
Hemoglobin: 9.8 — ABNORMAL LOW
RBC: 4.54

## 2012-02-20 ENCOUNTER — Encounter (HOSPITAL_COMMUNITY): Payer: Self-pay | Admitting: *Deleted

## 2012-02-20 ENCOUNTER — Emergency Department (HOSPITAL_COMMUNITY)
Admission: EM | Admit: 2012-02-20 | Discharge: 2012-02-20 | Disposition: A | Discharge status: Home / Self Care | Payer: Self-pay | Attending: Emergency Medicine | Admitting: Emergency Medicine

## 2012-02-20 DIAGNOSIS — E669 Obesity, unspecified: Secondary | ICD-10-CM | POA: Insufficient documentation

## 2012-02-20 DIAGNOSIS — R51 Headache: Secondary | ICD-10-CM | POA: Insufficient documentation

## 2012-02-20 MED ORDER — KETOROLAC TROMETHAMINE 30 MG/ML IJ SOLN
INTRAMUSCULAR | Status: AC
  Administered 2012-02-20: 15 mg
  Filled 2012-02-20: qty 1

## 2012-02-20 MED ORDER — METOCLOPRAMIDE HCL 5 MG/ML IJ SOLN
10.0000 mg | Freq: Once | INTRAMUSCULAR | Status: AC
  Administered 2012-02-20: 10 mg via INTRAVENOUS
  Filled 2012-02-20: qty 2

## 2012-02-20 MED ORDER — DIPHENHYDRAMINE HCL 50 MG/ML IJ SOLN
25.0000 mg | Freq: Once | INTRAMUSCULAR | Status: AC
  Administered 2012-02-20: 25 mg via INTRAVENOUS
  Filled 2012-02-20: qty 1

## 2012-02-20 MED ORDER — SODIUM CHLORIDE 0.9 % IV BOLUS (SEPSIS)
1000.0000 mL | Freq: Once | INTRAVENOUS | Status: AC
  Administered 2012-02-20: 1000 mL via INTRAVENOUS

## 2012-02-20 MED ORDER — KETOROLAC TROMETHAMINE 15 MG/ML IJ SOLN
15.0000 mg | INTRAMUSCULAR | Status: AC
  Filled 2012-02-20: qty 1

## 2012-02-20 NOTE — ED Notes (Signed)
Reports having headaches intermittent x 3 weeks, has not taken any otc meds for it. Also reports recently noticiing swelling to bilateral legs, but more severe in right leg. Ambulatory at triage, no acute distress noted.

## 2012-02-20 NOTE — Discharge Instructions (Signed)
Headache, General, Unknown Cause The specific cause of your headache may not have been found today. There are many causes and types of headache. A few common ones are:  Tension headache.   Migraine.   Infections (examples: dental and sinus infections).   Bone and/or joint problems in the neck or jaw.   Depression.   Eye problems.  These headaches are not life threatening.  Headaches can sometimes be diagnosed by a patient history and a physical exam. Sometimes, lab and imaging studies (such as x-ray and/or CT scan) are used to rule out more serious problems. In some cases, a spinal tap (lumbar puncture) may be requested. There are many times when your exam and tests may be normal on the first visit even when there is a serious problem causing your headaches. Because of that, it is very important to follow up with your doctor or local clinic for further evaluation. FINDING OUT THE RESULTS OF TESTS  If a radiology test was performed, a radiologist will review your results.   You will be contacted by the emergency department or your physician if any test results require a change in your treatment plan.   Not all test results may be available during your visit. If your test results are not back during the visit, make an appointment with your caregiver to find out the results. Do not assume everything is normal if you have not heard from your caregiver or the medical facility. It is important for you to follow up on all of your test results.  HOME CARE INSTRUCTIONS   Keep follow-up appointments with your caregiver, or any specialist referral.   Only take over-the-counter or prescription medicines for pain, discomfort, or fever as directed by your caregiver.   Biofeedback, massage, or other relaxation techniques may be helpful.   Ice packs or heat applied to the head and neck can be used. Do this three to four times per day, or as needed.   Call your doctor if you have any questions or  concerns.   If you smoke, you should quit.  SEEK MEDICAL CARE IF:   You develop problems with medications prescribed.   You do not respond to or obtain relief from medications.   You have a change from the usual headache.   You develop nausea or vomiting.  SEEK IMMEDIATE MEDICAL CARE IF:   If your headache becomes severe.   You have an unexplained oral temperature above 102 F (38.9 C), or as your caregiver suggests.   You have a stiff neck.   You have loss of vision.   You have muscular weakness.   You have loss of muscular control.   You develop severe symptoms different from your first symptoms.   You start losing your balance or have trouble walking.   You feel faint or pass out.  MAKE SURE YOU:   Understand these instructions.   Will watch your condition.   Will get help right away if you are not doing well or get worse.  Document Released: 12/10/2005 Document Revised: 08/22/2011 Document Reviewed: 07/29/2008 [facility] Patient Information 2012 [place], [place].Headaches, Frequently Asked Questions MIGRAINE HEADACHES Q: What is migraine? What causes it? How can I treat it? A: Generally, migraine headaches begin as a dull ache. Then they develop into a constant, throbbing, and pulsating pain. You may experience pain at the temples. You may experience pain at the front or back of one or both sides of the head. The pain is usually accompanied  can I treat it?  A: Generally, migraine headaches begin as a dull ache. Then they develop into a constant, throbbing, and pulsating pain. You may experience pain at the temples. You may experience pain at the front or back of one or both sides of the head. The pain is usually accompanied by a combination of:   Nausea.    Vomiting.    Sensitivity to light and noise.   Some people (about 15%) experience an aura (see below) before an attack. The cause of migraine is believed to be chemical reactions in the brain. Treatment for migraine may include over-the-counter or prescription medications. It may also include self-help techniques. These include relaxation training and biofeedback.    Q: What is an aura?   A: About 15% of people with migraine get an "aura". This is a sign of neurological symptoms that occur before a migraine headache. You may see wavy or jagged lines, dots, or flashing lights. You might experience tunnel vision or blind spots in one or both eyes. The aura can include visual or auditory hallucinations (something imagined). It may include disruptions in smell (such as strange odors), taste or touch. Other symptoms include:   Numbness.    A "pins and needles" sensation.    Difficulty in recalling or speaking the correct word.   These neurological events may last as long as 60 minutes. These symptoms will fade as the headache begins.  Q: What is a trigger?  A: Certain physical or environmental factors can lead to or "trigger" a migraine. These include:   Foods.    Hormonal changes.    Weather.    Stress.   It is important to remember that triggers are different for everyone. To help prevent migraine attacks, you need to figure out which triggers affect you. Keep a headache diary. This is a good way to track triggers. The diary will help you talk to your healthcare professional about your condition.  Q: Does weather affect migraines?  A: Bright sunshine, hot, humid conditions, and drastic changes in barometric pressure may lead to, or "trigger," a migraine attack in some people. But studies have shown that weather does not act as a trigger for everyone with migraines.  Q: What is the link between migraine and hormones?  A: Hormones start and regulate many of your body's functions. Hormones keep your body in balance within a constantly changing environment. The levels of hormones in your body are unbalanced at times. Examples are during menstruation, pregnancy, or menopause. That can lead to a migraine attack. In fact, about three quarters of all women with migraine report that their attacks are related to the menstrual cycle.    Q: Is there an increased risk of stroke for migraine sufferers?   A: The likelihood of a migraine attack causing a stroke is very remote. That is not to say that migraine sufferers cannot have a stroke associated with their migraines. In persons under age 40, the most common associated factor for stroke is migraine headache. But over the course of a person's normal life span, the occurrence of migraine headache may actually be associated with a reduced risk of dying from cerebrovascular disease due to stroke.    Q: What are acute medications for migraine?  A: Acute medications are used to treat the pain of the headache after it has started. Examples over-the-counter medications, NSAIDs, ergots, and triptans.    Q: What are the triptans?  A: Triptans are the newest class of abortive   medications. They are specifically targeted to treat migraine. Triptans are vasoconstrictors. They moderate some chemical reactions in the brain. The triptans work on receptors in your brain. Triptans help to restore the balance of a neurotransmitter called serotonin. Fluctuations in levels of serotonin are thought to be a main cause of migraine.    Q: Are over-the-counter medications for migraine effective?  A: Over-the-counter, or "OTC," medications may be effective in relieving mild to moderate pain and associated symptoms of migraine. But you should see your caregiver before beginning any treatment regimen for migraine.    Q: What are preventive medications for migraine?  A: Preventive medications for migraine are sometimes referred to as "prophylactic" treatments. They are used to reduce the frequency, severity, and length of migraine attacks. Examples of preventive medications include antiepileptic medications, antidepressants, beta-blockers, calcium channel blockers, and NSAIDs (nonsteroidal anti-inflammatory drugs).  Q: Why are anticonvulsants used to treat migraine?   A: During the past few years, there has been an increased interest in antiepileptic drugs for the prevention of migraine. They are sometimes referred to as "anticonvulsants". Both epilepsy and migraine may be caused by similar reactions in the brain.    Q: Why are antidepressants used to treat migraine?  A: Antidepressants are typically used to treat people with depression. They may reduce migraine frequency by regulating chemical levels, such as serotonin, in the brain.    Q: What alternative therapies are used to treat migraine?  A: The term "alternative therapies" is often used to describe treatments considered outside the scope of conventional Western medicine. Examples of alternative therapy include acupuncture, acupressure, and yoga. Another common alternative treatment is herbal therapy. Some herbs are believed to relieve headache pain. Always discuss alternative therapies with your caregiver before proceeding. Some herbal products contain arsenic and other toxins.  TENSION HEADACHES  Q: What is a tension-type headache? What causes it? How can I treat it?  A: Tension-type headaches occur randomly. They are often the result of temporary stress, anxiety, fatigue, or anger. Symptoms include soreness in your temples, a tightening band-like sensation around your head (a "vice-like" ache). Symptoms can also include a pulling feeling, pressure sensations, and contracting head and neck muscles. The headache begins in your forehead, temples, or the back of your head and neck. Treatment for tension-type headache may include over-the-counter or prescription medications. Treatment may also include self-help techniques such as relaxation training and biofeedback.  CLUSTER HEADACHES  Q: What is a cluster headache? What causes it? How can I treat it?   A: Cluster headache gets its name because the attacks come in groups. The pain arrives with little, if any, warning. It is usually on one side of the head. A tearing or bloodshot eye and a runny nose on the same side of the headache may also accompany the pain. Cluster headaches are believed to be caused by chemical reactions in the brain. They have been described as the most severe and intense of any headache type. Treatment for cluster headache includes prescription medication and oxygen.  SINUS HEADACHES  Q: What is a sinus headache? What causes it? How can I treat it?  A: When a cavity in the bones of the face and skull (a sinus) becomes inflamed, the inflammation will cause localized pain. This condition is usually the result of an allergic reaction, a tumor, or an infection. If your headache is caused by a sinus blockage, such as an infection, you will probably have a fever. An x-ray will confirm a sinus   that is being overused. Sometimes your caregiver will gradually substitute a different type of treatment or medication. Stopping may be a challenge. Regularly overusing a medication increases the potential for serious side effects. Consult a caregiver if you regularly use headache medications more than 2 days per week or more than the label advises. ADDITIONAL QUESTIONS AND ANSWERS Q: What is biofeedback? A: Biofeedback is a self-help treatment. Biofeedback uses special equipment to monitor your body's involuntary physical responses. Biofeedback monitors:  Breathing.   Pulse.   Heart rate.   Temperature.   Muscle tension.   Brain activity.  Biofeedback helps you refine and perfect your relaxation exercises. You learn to control the physical  responses that are related to stress. Once the technique has been mastered, you do not need the equipment any more. Q: Are headaches hereditary? A: Four out of five (80%) of people that suffer report a family history of migraine. Scientists are not sure if this is genetic or a family predisposition. Despite the uncertainty, a child has a 50% chance of having migraine if one parent suffers. The child has a 75% chance if both parents suffer.  Q: Can children get headaches? A: By the time they reach high school, most young people have experienced some type of headache. Many safe and effective approaches or medications can prevent a headache from occurring or stop it after it has begun.  Q: What type of doctor should I see to diagnose and treat my headache? A: Start with your primary caregiver. Discuss his or her experience and approach to headaches. Discuss methods of classification, diagnosis, and treatment. Your caregiver may decide to recommend you to a headache specialist, depending upon your symptoms or other physical conditions. Having diabetes, allergies, etc., may require a more comprehensive and inclusive approach to your headache. The National Headache Foundation will provide, upon request, a list of [facility] physician members in your state. Document Released: 03/01/2004 Document Revised: 08/22/2011 Document Reviewed: 08/09/2008 [facility] Patient Information 2012 [place], [place].  RESOURCE GUIDE  Dental Problems  Patients with Medicaid: [facility] [phone] [address]                                           [phone] [address] Phone:  [phone]                                                  Phone:  [phone]  If unable to pay or uninsured, contact:  [facility] or [facility]. to become qualified for the adult dental clinic.  Chronic Pain Problems Contact [facility]  [phone] Patients need to be  referred by their primary care doctor.  Insufficient Money for Medicine Contact [employer]:  call "211" or [facility] 985-464-0724.  No Primary Care Doctor Call [facility]  979-420-8395 Other agencies that provide inexpensive medical care    [facility]  [phone]    [facility]  [phone]    [facility]  [phone]    [facility]  [phone]    [facility]  [phone]    [facility]  [phone]  Psychological Services [facility] Behavior[facility]  [phone] [facility]  [phone] [facility]   [phone] (emergency services [phone])  Substance Abuse Resources [facility]  [phone] [facility] (440)665-1768 [facility] [phone] Floyd[facility] Flock (463) [phone] Residential & Outpatient Substance Abuse Program  ([phone]  Abuse/Neglect [facility] Child Abuse Hotline 352-423-9634 [facility] Child Abuse Hotline (701)795-5041 (After Hours)  Emergency Shelter [facility] 570-783-7798  Maternity Homes [facility] at the [place] [facility] (925)419-5303 [facility] 2198407911  MRSA Hotline #:   (702) 064-4837    [facility] Resources  [facility]     [employer]                          [facility] Dept. [address] [place]                       [address]      [address]  Blondell Reveal Phone:  [phone]                                   Phone:  [phone]                 Phone:  [phone]  [facility] Phone:  [phone]  [facility] Child Abuse Hotline [phone] (After Hours)

## 2012-02-20 NOTE — ED Notes (Signed)
D/c instructions reviewed w/ pt - pt denies any further questions or concerns at present.   

## 2012-02-20 NOTE — ED Provider Notes (Signed)
History    35yf with HA. Gradual onset a few weeks ago.Shanon Rosser remember what was doing at onset. Intermittent since. Diffuse. Achy. No appreciable exacerbating or relieving factors. No fever or chills. Denies trauma. No visual complaints. No n/v. Denies use of blood thinning medications. No significant ha hx. denies family hx of cerebral aneurysm. No numbness, tingling or loss of strength.  CSN: [id number]  Arrival date & time 02/20/12  1732    First MD Initiated Contact with Patient 02/20/12 1956      Chief Complaint  Patient presents with  . Headache    (Consider location/radiation/quality/duration/timing/severity/associated sxs/prior treatment) HPI  History reviewed. No pertinent past medical history.  History reviewed. No pertinent past surgical history.  History reviewed. No pertinent family history.  History  Substance Use Topics  . Smoking status: Not on file  . Smokeless tobacco: Not on file  . Alcohol Use: No    OB History    Grav Para Term Preterm Abortions TAB SAB Ect Mult Living                  Review of Systems   Review of symptoms negative unless otherwise noted in HPI.   Allergies  Morphine and related  Home Medications   Current Outpatient Rx  Name Route Sig Dispense Refill  . EVENING PRIMROSE OIL 1000 MG PO CAPS Oral Take 3 capsules by mouth daily.    . OMEGA-3 FATTY ACIDS 1000 MG PO CAPS Oral Take 2 g by mouth daily.    Carma Leaven M PLUS PO TABS Oral Take 1 tablet by mouth daily.      BP 138/84  Pulse 91  Temp(Src) 97.9 F (36.6 C) (Oral)  Resp 16  SpO2 100%  LMP 02/10/2012  Physical Exam  Nursing note and vitals reviewed. Constitutional: She is oriented to person, place, and time. She appears well-developed and well-nourished. No distress.       Sitting up in bed. No acute distress. obese  HENT:  Head: Normocephalic and atraumatic.  Right Ear: External ear normal.  Left Ear: External ear normal.  Eyes: Conjunctivae and EOM are  normal. Pupils are equal, round, and reactive to light. Right eye exhibits no discharge. Left eye exhibits no discharge.  Neck: Normal range of motion. Neck supple.  Cardiovascular: Normal rate, regular rhythm and normal heart sounds.  Exam reveals no gallop and no friction rub.   No murmur heard. Pulmonary/Chest: Effort normal and breath sounds normal. No respiratory distress.  Abdominal: Soft. She exhibits no distension. There is no tenderness.  Musculoskeletal: She exhibits no edema and no tenderness.  Lymphadenopathy:    She has no cervical adenopathy.  Neurological: She is alert and oriented to person, place, and time. No cranial nerve deficit. She exhibits normal muscle tone. Coordination normal.       Good finger nose testing bilaterally. Normal-appearing gait.  Skin: Skin is warm and dry. She is not diaphoretic.  Psychiatric: She has a normal mood and affect. Her behavior is normal. Thought content normal.    ED Course  Procedures (including critical care time)  Labs Reviewed - No data to display No results found.   1. Headache       MDM  36 year old female with headache. Suspect primary headache. Consider secondary causes such as infectious, bleed or mass but doubt. Consider ocular etiology but doubt as well. Patient has no acute visual complaints and has a nonfocal exam. Consider carotid or vertebral artery dissection but doubt. no  history of trauma. Patient is afebrile and  has no signs of meningismus. She's not on any blood thinning medications. No contacts with similar to suggest CO poisoning. Doubt cerebral venous thrombosis. Patient has no neurological complaints aside from headache and has a nonfocal neurological examination. Patient reports complete relief of symptoms after treatment. She has no new complaints prior to discharge. Return precautions were discussed. Outpatient followup as needed        Raeford Razor, MD 02/21/12 1422

## 2012-07-04 ENCOUNTER — Emergency Department (HOSPITAL_COMMUNITY)
Admission: EM | Admit: 2012-07-04 | Discharge: 2012-07-04 | Disposition: A | Discharge status: Home / Self Care | Payer: Self-pay | Attending: Emergency Medicine | Admitting: Emergency Medicine

## 2012-07-04 ENCOUNTER — Emergency Department (HOSPITAL_COMMUNITY): Payer: Self-pay

## 2012-07-04 ENCOUNTER — Encounter (HOSPITAL_COMMUNITY): Payer: Self-pay | Admitting: Emergency Medicine

## 2012-07-04 DIAGNOSIS — F17200 Nicotine dependence, unspecified, uncomplicated: Secondary | ICD-10-CM | POA: Insufficient documentation

## 2012-07-04 DIAGNOSIS — R109 Unspecified abdominal pain: Secondary | ICD-10-CM | POA: Insufficient documentation

## 2012-07-04 DIAGNOSIS — R102 Pelvic and perineal pain: Secondary | ICD-10-CM

## 2012-07-04 DIAGNOSIS — R10819 Abdominal tenderness, unspecified site: Secondary | ICD-10-CM | POA: Insufficient documentation

## 2012-07-04 DIAGNOSIS — R110 Nausea: Secondary | ICD-10-CM | POA: Insufficient documentation

## 2012-07-04 DIAGNOSIS — N949 Unspecified condition associated with female genital organs and menstrual cycle: Secondary | ICD-10-CM | POA: Insufficient documentation

## 2012-07-04 LAB — URINE MICROSCOPIC-ADD ON

## 2012-07-04 LAB — URINALYSIS, ROUTINE W REFLEX MICROSCOPIC
Bilirubin Urine: NEGATIVE
Glucose, UA: NEGATIVE mg/dL
Hgb urine dipstick: NEGATIVE
Specific Gravity, Urine: 1.021 (ref 1.005–1.030)
Urobilinogen, UA: 0.2 mg/dL (ref 0.0–1.0)

## 2012-07-04 LAB — COMPREHENSIVE METABOLIC PANEL
ALT: 10 U/L (ref 0–35)
AST: 16 U/L (ref 0–37)
CO2: 26 mEq/L (ref 19–32)
Calcium: 9.2 mg/dL (ref 8.4–10.5)
Chloride: 105 mEq/L (ref 96–112)
GFR calc non Af Amer: >90 mL/min (ref >90)
Sodium: 141 mEq/L (ref 135–145)
Total Bilirubin: 0.2 mg/dL — ABNORMAL LOW (ref 0.3–1.2)

## 2012-07-04 LAB — CBC WITH DIFFERENTIAL/PLATELET
Basophils Absolute: 0 10*3/uL (ref 0.0–0.1)
Lymphocytes Relative: 32 % (ref 12–46)
Neutro Abs: 5.8 10*3/uL (ref 1.7–7.7)
Platelets: 252 10*3/uL (ref 150–400)
RDW: 15.3 % (ref 11.5–15.5)
WBC: 10.5 10*3/uL (ref 4.0–10.5)

## 2012-07-04 LAB — WET PREP, GENITAL: Trich, Wet Prep: NONE SEEN

## 2012-07-04 MED ORDER — DOXYCYCLINE HYCLATE 100 MG PO TABS
100.0000 mg | ORAL_TABLET | Freq: Once | ORAL | Status: AC
Start: 2012-07-04 — End: 2012-07-04
  Administered 2012-07-04: 100 mg via ORAL
  Filled 2012-07-04: qty 1

## 2012-07-04 MED ORDER — ONDANSETRON HCL 4 MG/2ML IJ SOLN: 4.0000 mg | Freq: Once | INTRAMUSCULAR | Status: DC

## 2012-07-04 MED ORDER — ONDANSETRON HCL 4 MG/2ML IJ SOLN
4.0000 mg | Freq: Once | INTRAMUSCULAR | Status: AC
  Administered 2012-07-04: 4 mg via INTRAVENOUS

## 2012-07-04 MED ORDER — DEXTROSE 5 % IV SOLN
250.0000 mg | Freq: Once | INTRAVENOUS | Status: AC
  Administered 2012-07-04: 250 mg via INTRAVENOUS
  Filled 2012-07-04: qty 250

## 2012-07-04 MED ORDER — HYDROMORPHONE HCL PF 1 MG/ML IJ SOLN
1.0000 mg | Freq: Once | INTRAMUSCULAR | Status: AC
  Administered 2012-07-04: 1 mg via INTRAVENOUS
  Filled 2012-07-04: qty 1

## 2012-07-04 MED ORDER — OXYCODONE-ACETAMINOPHEN 5-325 MG PO TABS: 1.0000 | ORAL_TABLET | Freq: Four times a day (QID) | ORAL | Status: AC | PRN

## 2012-07-04 MED ORDER — ONDANSETRON HCL 4 MG/2ML IJ SOLN
INTRAMUSCULAR | Status: AC
  Administered 2012-07-04: 4 mg via INTRAVENOUS
  Filled 2012-07-04: qty 2

## 2012-07-04 MED ORDER — DOXYCYCLINE HYCLATE 100 MG PO CAPS: 100.0000 mg | ORAL_CAPSULE | Freq: Two times a day (BID) | ORAL | Status: AC

## 2012-07-04 MED ORDER — OXYCODONE-ACETAMINOPHEN 5-325 MG PO TABS
1.0000 | ORAL_TABLET | Freq: Once | ORAL | Status: AC
  Administered 2012-07-04: 1 via ORAL
  Filled 2012-07-04 (×2): qty 1

## 2012-07-04 MED ORDER — KETOROLAC TROMETHAMINE 30 MG/ML IJ SOLN
30.0000 mg | Freq: Once | INTRAMUSCULAR | Status: AC
  Administered 2012-07-04: 30 mg via INTRAVENOUS
  Filled 2012-07-04: qty 1

## 2012-07-04 MED ORDER — SODIUM CHLORIDE 0.9 % IV BOLUS (SEPSIS)
1000.0000 mL | Freq: Once | INTRAVENOUS | Status: AC
  Administered 2012-07-04: 1000 mL via INTRAVENOUS

## 2012-07-04 NOTE — ED Provider Notes (Addendum)
History     CSN: [id number]  Arrival date & time 07/04/12  0107   First MD Initiated Contact with Patient 07/04/12 [phone]      Chief Complaint  Patient presents with  . Flank Pain    (Consider location/radiation/quality/duration/timing/severity/associated sxs/prior treatment) Patient is a 36 y.o. female presenting with flank pain. The history is provided by the patient.  Flank Pain This is a new (pt with abd pain that started today when she woke up and recently worsened on the left only.) problem. The current episode started 12 to 24 hours ago. The problem occurs constantly. The problem has been rapidly worsening. Associated symptoms include abdominal pain. Pertinent negatives include no shortness of breath. Associated symptoms comments: Nausea but no vomiting.. Nothing aggravates the symptoms. Nothing relieves the symptoms. She has tried rest for the symptoms. The treatment provided no relief.    History reviewed. No pertinent past medical history.  History reviewed. No pertinent past surgical history.  History reviewed. No pertinent family history.  History  Substance Use Topics  . Smoking status: Current Some Day Smoker    Types: Cigarettes  . Smokeless tobacco: Not on file  . Alcohol Use: No    OB History    Grav Para Term Preterm Abortions TAB SAB Ect Mult Living                  Review of Systems  Constitutional: Negative for fever and chills.  Respiratory: Negative for cough and shortness of breath.   Gastrointestinal: Positive for abdominal pain.  Genitourinary: Positive for flank pain. Negative for dysuria, urgency, frequency and vaginal discharge.  All other systems reviewed and are negative.    Allergies  Morphine and related  Home Medications  No current outpatient prescriptions on file.  BP 159/97  Pulse 99  Temp 98.4 F (36.9 C) (Oral)  Resp 22  SpO2 98%  LMP 06/18/2012  Physical Exam  Nursing note and vitals reviewed. Constitutional: She  is oriented to person, place, and time. She appears well-developed and well-nourished. No distress.  HENT:  Head: Normocephalic and atraumatic.  Mouth/Throat: Oropharynx is clear and moist.  Eyes: Conjunctivae and EOM are normal. Pupils are equal, round, and reactive to light.  Neck: Normal range of motion. Neck supple.  Cardiovascular: Normal rate, regular rhythm and intact distal pulses.   No murmur heard. Pulmonary/Chest: Effort normal and breath sounds normal. No respiratory distress. She has no wheezes. She has no rales.  Abdominal: Soft. Normal appearance. She exhibits no distension. There is no tenderness. There is no rebound, no guarding and no CVA tenderness.    Musculoskeletal: Normal range of motion. She exhibits no edema and no tenderness.  Neurological: She is alert and oriented to person, place, and time.  Skin: Skin is warm and dry. No rash noted. No erythema.  Psychiatric: She has a normal mood and affect. Her behavior is normal.    ED Course  Procedures (including critical care time)  Labs Reviewed  CBC WITH DIFFERENTIAL - Abnormal; Notable for the following:    Hemoglobin 10.2 (*)     HCT 32.3 (*)     MCV 75.1 (*)     MCH 23.7 (*)     All other components within normal limits  COMPREHENSIVE METABOLIC PANEL - Abnormal; Notable for the following:    Glucose, Bld 123 (*)     Albumin 3.4 (*)     Total Bilirubin 0.2 (*)     All other components within normal  limits  URINALYSIS, ROUTINE W REFLEX MICROSCOPIC - Abnormal; Notable for the following:    Leukocytes, UA SMALL (*)     All other components within normal limits  WET PREP, GENITAL - Abnormal; Notable for the following:    Clue Cells Wet Prep HPF POC MANY (*)     WBC, Wet Prep HPF POC MANY (*)     All other components within normal limits  URINE MICROSCOPIC-ADD ON - Abnormal; Notable for the following:    Squamous Epithelial / LPF FEW (*)     All other components within normal limits  GC/CHLAMYDIA PROBE  AMP, GENITAL   US Transvaginal Non-ob  07/04/2012  *RADIOLOGY REPORT*  Clinical Data:  Left-sided pelvic pain.  TRANSABDOMINAL AND TRANSVAGINAL ULTRASOUND OF PELVIS DOPPLER ULTRASOUND OF OVARIES  Technique:  Both transabdominal and transvaginal ultrasound examinations of the pelvis were performed. Transabdominal technique was performed for global imaging of the pelvis including uterus, ovaries, adnexal regions, and pelvic cul-de-sac.  It was necessary to proceed with endovaginal exam following the transabdominal exam to visualize the endometrium.  Color and duplex Doppler ultrasound was utilized to evaluate blood flow to the ovaries.  Comparison:  Pelvic ultrasound performed 11/15/2008  Findings:  Uterus:  Normal in size and appearance; measures 9.0 x 4.1 x 5.3 cm.  Nabothian cysts are noted at the cervix.  Endometrium:  Normal in thickness and appearance; measures 1.2 cm in thickness.  Right ovary: Normal appearance/no adnexal mass; measures 2.9 x 2.8 x 2.3 cm.  Left ovary:   Normal appearance/no adnexal mass; measures 2.6 x 1.9 x 2.4 cm.  Pulsed Doppler evaluation demonstrates normal low-resistance arterial and venous waveforms in both ovaries.  Resistive indices measure 0.62 on the right and 0.60 on the left, within normal limits.  A small amount of free fluid within the pelvic cul-de-sac is likely physiologic in nature.  IMPRESSION: Normal exam.  No evidence of pelvic mass or other significant abnormality.  No sonographic evidence for ovarian torsion.  Original Report Authenticated By: Tonia Ghent, M.D.   US Pelvis Complete  07/04/2012  *RADIOLOGY REPORT*  Clinical Data:  Left-sided pelvic pain.  TRANSABDOMINAL AND TRANSVAGINAL ULTRASOUND OF PELVIS DOPPLER ULTRASOUND OF OVARIES  Technique:  Both transabdominal and transvaginal ultrasound examinations of the pelvis were performed. Transabdominal technique was performed for global imaging of the pelvis including uterus, ovaries, adnexal regions, and pelvic  cul-de-sac.  It was necessary to proceed with endovaginal exam following the transabdominal exam to visualize the endometrium.  Color and duplex Doppler ultrasound was utilized to evaluate blood flow to the ovaries.  Comparison:  Pelvic ultrasound performed 11/15/2008  Findings:  Uterus:  Normal in size and appearance; measures 9.0 x 4.1 x 5.3 cm.  Nabothian cysts are noted at the cervix.  Endometrium:  Normal in thickness and appearance; measures 1.2 cm in thickness.  Right ovary: Normal appearance/no adnexal mass; measures 2.9 x 2.8 x 2.3 cm.  Left ovary:   Normal appearance/no adnexal mass; measures 2.6 x 1.9 x 2.4 cm.  Pulsed Doppler evaluation demonstrates normal low-resistance arterial and venous waveforms in both ovaries.  Resistive indices measure 0.62 on the right and 0.60 on the left, within normal limits.  A small amount of free fluid within the pelvic cul-de-sac is likely physiologic in nature.  IMPRESSION: Normal exam.  No evidence of pelvic mass or other significant abnormality.  No sonographic evidence for ovarian torsion.  Original Report Authenticated By: Tonia Ghent, M.D.   [place] Art/ven Flow Abd Pelv Doppler  07/04/2012  *RADIOLOGY REPORT*  Clinical Data:  Left-sided pelvic pain.  TRANSABDOMINAL AND TRANSVAGINAL ULTRASOUND OF PELVIS DOPPLER ULTRASOUND OF OVARIES  Technique:  Both transabdominal and transvaginal ultrasound examinations of the pelvis were performed. Transabdominal technique was performed for global imaging of the pelvis including uterus, ovaries, adnexal regions, and pelvic cul-de-sac.  It was necessary to proceed with endovaginal exam following the transabdominal exam to visualize the endometrium.  Color and duplex Doppler ultrasound was utilized to evaluate blood flow to the ovaries.  Comparison:  Pelvic ultrasound performed 11/15/2008  Findings:  Uterus:  Normal in size and appearance; measures 9.0 x 4.1 x 5.3 cm.  Nabothian cysts are noted at the cervix.  Endometrium:  Normal  in thickness and appearance; measures 1.2 cm in thickness.  Right ovary: Normal appearance/no adnexal mass; measures 2.9 x 2.8 x 2.3 cm.  Left ovary:   Normal appearance/no adnexal mass; measures 2.6 x 1.9 x 2.4 cm.  Pulsed Doppler evaluation demonstrates normal low-resistance arterial and venous waveforms in both ovaries.  Resistive indices measure 0.62 on the right and 0.60 on the left, within normal limits.  A small amount of free fluid within the pelvic cul-de-sac is likely physiologic in nature.  IMPRESSION: Normal exam.  No evidence of pelvic mass or other significant abnormality.  No sonographic evidence for ovarian torsion.  Original Report Authenticated By: Tonia Ghent, M.D.     1. Pelvic pain in female       MDM   Patient with abdominal pain which is most likely either kidney stone or ovarian cyst. She denies any urinary symptoms, infectious symptoms or vaginal discharge. Patient was in her normal state of health until this morning. Low suspicion for diverticulitis and patient has normal vital signs but is uncomfortable appearing. CBC, CMP, UA pending. Will also do a pelvic exam for further evaluation whether patient needs an ultrasound or CT.  2:31 AM CBC, CMP wnl.  Pelvic with pain over the left ovary.  Toradol given due to persistent pain.   4:28 AM U/S wnl.  Pt has numerous WBCs on wet prep and may have infection.   Pain more controlled and no blood in the urine to indicate concern for kidney stone.  6:46 AM Pt is refusing percocet stating it does not help and states the dilaudid did not change her pain but the toradol did help.  Pt wants to take ibuprofen but told her she can't have any until at least 8 hours after toradol dose.  Gwyneth Sprout, MD 07/04/12 4098  Gwyneth Sprout, MD 07/04/12 1191  Gwyneth Sprout, MD 07/04/12 4782  Gwyneth Sprout, MD 07/04/12 9562  Gwyneth Sprout, MD 07/04/12 [phone]

## 2012-07-04 NOTE — ED Notes (Signed)
Pt states dilaudid did not diminish pain.  Pt states it was the ketorolac that worked.  It was explained to pt that she needs to wait until 12 pm to take ibuprofen.  Pt agreed to take a percocet.

## 2012-07-04 NOTE — ED Notes (Signed)
PT back from [place].  States pain improved.

## 2012-07-04 NOTE — ED Notes (Signed)
Patient states she started having left flank pain earlier in the evening.  No vomiting but patient is nauseated.

## 2012-07-05 LAB — GC/CHLAMYDIA PROBE AMP, GENITAL
Chlamydia, DNA Probe: NEGATIVE
GC Probe Amp, Genital: NEGATIVE

## 2012-07-29 ENCOUNTER — Encounter (HOSPITAL_COMMUNITY): Payer: Self-pay | Admitting: *Deleted

## 2012-07-29 ENCOUNTER — Inpatient Hospital Stay (HOSPITAL_COMMUNITY)
Admission: AD | Admit: 2012-07-29 | Discharge: 2012-07-29 | Disposition: A | Discharge status: Home / Self Care | Payer: Self-pay | Source: Ambulatory Visit | Attending: Family Medicine | Admitting: Family Medicine

## 2012-07-29 DIAGNOSIS — N940 Mittelschmerz: Secondary | ICD-10-CM

## 2012-07-29 DIAGNOSIS — R1032 Left lower quadrant pain: Secondary | ICD-10-CM | POA: Insufficient documentation

## 2012-07-29 HISTORY — DX: Other specified health status: Z78.9

## 2012-07-29 LAB — WET PREP, GENITAL
Clue Cells Wet Prep HPF POC: NONE SEEN
Trich, Wet Prep: NONE SEEN
Yeast Wet Prep HPF POC: NONE SEEN

## 2012-07-29 LAB — URINALYSIS, ROUTINE W REFLEX MICROSCOPIC
Bilirubin Urine: NEGATIVE
Glucose, UA: NEGATIVE mg/dL
Hgb urine dipstick: NEGATIVE
Ketones, ur: 15 mg/dL — AB
Nitrite: NEGATIVE
pH: 6.5 (ref 5.0–8.0)

## 2012-07-29 MED ORDER — NAPROXEN SODIUM 550 MG PO TABS: 550.0000 mg | ORAL_TABLET | Freq: Two times a day (BID) | ORAL | Status: DC

## 2012-07-29 MED ORDER — KETOROLAC TROMETHAMINE 60 MG/2ML IM SOLN
60.0000 mg | Freq: Once | INTRAMUSCULAR | Status: AC
  Administered 2012-07-29: 60 mg via INTRAMUSCULAR
  Filled 2012-07-29: qty 2

## 2012-07-29 NOTE — MAU Provider Note (Signed)
History     CSN: [id number]  Arrival date and time: 07/29/12 2020   First Provider Initiated Contact with Patient 07/29/12 2134      Chief Complaint  Patient presents with  . Abdominal Pain   Abdominal Pain This is a new problem. The current episode started yesterday. The onset quality is sudden. The problem occurs constantly. The problem has been gradually worsening. The pain is located in the LLQ. The pain is at a severity of 6/10. The quality of the pain is cramping and sharp. The abdominal pain radiates to the back. Associated symptoms include nausea. Pertinent negatives include no constipation, diarrhea, dysuria, fever or vomiting.   Pt presents with lower left side abd pain that started last night. Described as sharp, stabbing pain that radiates down her left leg and to her back. Some nausea. Pt states pain is similar to previous pain with ovarian cysts. Pt has not tried anything to relieve the pain. Pts last LMP was 7/15 and she has had a BTL.  Past Medical History  Diagnosis Date  . No pertinent past medical history     Past Surgical History  Procedure Date  . Endometrial ablation   . Cesarean section     Family History  Problem Relation Age of Onset  . Other Neg Hx     History  Substance Use Topics  . Smoking status: Current Everyday Smoker -- 0.2 packs/day    Types: Cigarettes  . Smokeless tobacco: Not on file  . Alcohol Use: No    Allergies:  Allergies  Allergen Reactions  . Morphine And Related Hives    Prescriptions prior to admission  Medication Sig Dispense Refill  . EVENING PRIMROSE OIL PO Take 1 capsule by mouth daily.      Marland Kitchen ibuprofen (ADVIL,MOTRIN) 200 MG tablet Take 400 mg by mouth every 6 (six) hours as needed. For pain      . Multiple Vitamin (MULTIVITAMIN WITH MINERALS) TABS Take 1 tablet by mouth daily.      Marland Kitchen OVER THE COUNTER MEDICATION Take 1 capsule by mouth daily. OTC tumeric supplement        Review of Systems  Constitutional:  Negative for fever.  Gastrointestinal: Positive for nausea and abdominal pain. Negative for vomiting, diarrhea and constipation.  Genitourinary: Negative for dysuria.   Physical Exam   Blood pressure 128/67, pulse 97, temperature 98.8 F (37.1 C), temperature source Oral, resp. rate 20, height 5' 2.25" (1.581 m), weight 111.642 kg (246 lb 2 oz), last menstrual period 07/07/2012.  Physical Exam  Vitals reviewed. Constitutional: She is oriented to person, place, and time. She appears well-developed and well-nourished.  Cardiovascular: Normal rate, regular rhythm and normal heart sounds.   Respiratory: Effort normal and breath sounds normal.  GI: Soft. Bowel sounds are normal. She exhibits no distension and no mass. There is tenderness. There is no guarding and no CVA tenderness.         Tenderness in left lower quadrant   Genitourinary: Vagina normal and uterus normal. Cervix exhibits no motion tenderness and no friability. Left adnexum displays tenderness.  Neurological: She is alert and oriented to person, place, and time.    MAU Course  Procedures  Results for orders placed during the hospital encounter of 07/29/12 (from the past 24 hour(s))  URINALYSIS, ROUTINE W REFLEX MICROSCOPIC     Status: Abnormal   Collection Time   07/29/12  8:51 PM      Component Value Range   Color, Urine  YELLOW  YELLOW   APPearance CLEAR  CLEAR   Specific Gravity, Urine 1.020  1.005 - 1.030   pH 6.5  5.0 - 8.0   Glucose, UA NEGATIVE  NEGATIVE mg/dL   Hgb urine dipstick NEGATIVE  NEGATIVE   Bilirubin Urine NEGATIVE  NEGATIVE   Ketones, ur 15 (*) NEGATIVE mg/dL   Protein, ur NEGATIVE  NEGATIVE mg/dL   Urobilinogen, UA 1.0  0.0 - 1.0 mg/dL   Nitrite NEGATIVE  NEGATIVE   Leukocytes, UA NEGATIVE  NEGATIVE  POCT PREGNANCY, URINE     Status: Normal   Collection Time   07/29/12  9:13 PM      Component Value Range   Preg Test, Ur NEGATIVE  NEGATIVE  WET PREP, GENITAL     Status: Abnormal   Collection  Time   07/29/12 10:20 PM      Component Value Range   Yeast Wet Prep HPF POC NONE SEEN  NONE SEEN   Trich, Wet Prep NONE SEEN  NONE SEEN   Clue Cells Wet Prep HPF POC NONE SEEN  NONE SEEN   WBC, Wet Prep HPF POC FEW (*) NONE SEEN    Assessment and Plan  1. Abd pain. DDX- Ovarian torsion, ovarian cyst, nephrolithiasis, UTI, STI, Mittelschmerz  Pt was seen on 7/12 at [facility] with similar symptoms and had a normal work-up (Neg. Torsion, cyst, no stone, UTI, or STI) except for BV. It is unclear from the previous note whether she received treatment for the BV though pt states she did receive some medication for PID from the ED but does not recall the name of the medication.    Wet prep here was negative for BV. Urinalysis did not show an infection or blood. Chlamydia  and Gonorrhea testing were negative on 7/12. Pelvic ultrasound was also negative on 7/12.  Final Dx: [place]z  V. [place], [place], PA-S2  [place], [place] 07/29/2012, 9:43 PM   Breezy C Negron is a 36 y.o. female who is not pregnant. She had a complete work up in the ED in July for same symptoms as tonight. She has not had to take any pain medication since her last period.   I have examined this patient and assisted the student with assessment and plan of care.  I have reviewed this patient's vital signs, nurses notes, appropriate labs and imaging. I have discussed the results with the patient and need for follow up in the GYN Clinic. I will send a note to the GYN Clinic so they can schedule follow up. Follow-up Information    Follow up with [facility]. (someone will call you)    Contact information:   [address] [place] [address]         Medication List  As of 07/29/2012 11:27 PM   START taking these medications         naproxen sodium 550 MG tablet   Commonly known as: ANAPROX   Take 1 tablet (550 mg total) by mouth 2 (two) times daily with a meal.         CONTINUE taking  these medications         EVENING PRIMROSE OIL PO      ibuprofen 200 MG tablet   Commonly known as: ADVIL,MOTRIN      multivitamin with minerals Tabs      OVER THE COUNTER MEDICATION          Where to get your medications  These are the prescriptions that you need to pick up.   You may get these medications from any pharmacy.         naproxen sodium 550 MG tablet

## 2012-07-29 NOTE — MAU Note (Signed)
Pt states, " I started having sharp crampy pain in my left low abdomen last night and now all day today. It is worse today and the pain in now shooting down the front of my left. My low back hurts a little  Too. This is the same way that I felt two weeks ago when I went to [facility]."

## 2012-07-30 NOTE — MAU Provider Note (Signed)
Chart reviewed and agree with management and plan.  

## 2012-08-01 ENCOUNTER — Ambulatory Visit: Payer: Self-pay | Admitting: Family Medicine

## 2012-08-04 ENCOUNTER — Ambulatory Visit: Payer: Self-pay | Admitting: Obstetrics & Gynecology

## 2012-08-27 ENCOUNTER — Ambulatory Visit: Payer: Self-pay | Admitting: Advanced Practice Midwife

## 2012-09-15 ENCOUNTER — Ambulatory Visit: Payer: Self-pay | Admitting: Advanced Practice Midwife

## 2012-10-01 ENCOUNTER — Ambulatory Visit: Payer: Self-pay | Admitting: Obstetrics and Gynecology

## 2012-11-17 ENCOUNTER — Encounter: Payer: Self-pay | Admitting: Obstetrics and Gynecology

## 2012-11-17 ENCOUNTER — Ambulatory Visit (INDEPENDENT_AMBULATORY_CARE_PROVIDER_SITE_OTHER): Payer: Self-pay | Admitting: Obstetrics and Gynecology

## 2012-11-17 VITALS — BP 138/83 | HR 83 | Temp 98.5°F | Ht 62.0 in | Wt 242.2 lb

## 2012-11-17 DIAGNOSIS — N949 Unspecified condition associated with female genital organs and menstrual cycle: Secondary | ICD-10-CM

## 2012-11-17 DIAGNOSIS — N926 Irregular menstruation, unspecified: Secondary | ICD-10-CM

## 2012-11-17 DIAGNOSIS — R102 Pelvic and perineal pain: Secondary | ICD-10-CM | POA: Insufficient documentation

## 2012-11-17 DIAGNOSIS — Z01419 Encounter for gynecological examination (general) (routine) without abnormal findings: Secondary | ICD-10-CM

## 2012-11-17 LAB — POCT PREGNANCY, URINE: Preg Test, Ur: NEGATIVE

## 2012-11-17 NOTE — Progress Notes (Signed)
Referred from MAU for routine annual pap and followup pelvic pain. Patient states this time her cycle lasted extra 2 days  And had a lot of nausea. States the previous cycle had a different odor.

## 2012-11-17 NOTE — Progress Notes (Signed)
CC: Gynecologic Exam and Follow-up     HPI Renee [name] is a 36 y.o. [id number] who presents for Pap and F/U pelvic pain/ LMP 11/15, cycles regular though she had endometrial ablation for menorrhagia, flow usually scanty and 2-3 days duration. Lasted 5 days last cycle. Having suprapubic and pelvic pain Which is always present but waxes and wanes. She notices it more around her menses and before and after sexual intercourse. She did use condoms to see if that would help and had less discomfort using the condom. She had a negative W/U for abd and pelvic pain at [facility] in July 2013 ruling out torsion, cyst, kidneys and, UTI or STI. She does say she's had ovarian cysts in the past. She was seen in [facility] about a month ago and diagnosed with mittelschmerz. She was advised to take Naprosyn more often but a takes it or ibuprofen once or twice a day. Last Pap was several years ago. Denies hx abnormals.   Past Medical History  Diagnosis Date  . No pertinent past medical history   . Pelvic pain in female     OB History    Grav Para Term Preterm Abortions TAB SAB Ect Mult Living   2 2 2       2      # Outc Date GA Lbr Len/2nd Wgt Sex Del Anes PTL Lv   1 TRM 1/94     CS      2 TRM 4/95     CS         Past Surgical History  Procedure Date  . Endometrial ablation   . Cesarean section   . Tubal ligation     History   Social History  . Marital Status: Married    Spouse Name: N/A    Number of Children: N/A  . Years of Education: N/A   Occupational History  . Not on file.   Social History Main Topics  . Smoking status: Former Smoker -- 0.2 packs/day    Types: Cigarettes    Quit date: 11/11/2012  . Smokeless tobacco: Never Used  . Alcohol Use: No  . Drug Use: No  . Sexually Active: Yes    Birth Control/ Protection: Surgical   Other Topics Concern  . Not on file   Social History Narrative  . No narrative on file    Current Outpatient Prescriptions on File Prior to Visit    Medication Sig Dispense Refill  . EVENING PRIMROSE OIL PO Take 1 capsule by mouth daily.      Marland Kitchen ibuprofen (ADVIL,MOTRIN) 200 MG tablet Take 400 mg by mouth every 6 (six) hours as needed. For pain      . Multiple Vitamin (MULTIVITAMIN WITH MINERALS) TABS Take 1 tablet by mouth daily.      Marland Kitchen OVER THE COUNTER MEDICATION Take 1 capsule by mouth daily. OTC tumeric supplement        Allergies  Allergen Reactions  . Morphine And Related Hives    ROS Pertinent items in HPI  PHYSICAL EXAM Filed Vitals:   11/17/12 1343  BP: 138/83  Pulse: 83  Temp: 98.5 F (36.9 C)   General: Well nourished, well developed female in no acute distress Cardiovascular: Normal rate Respiratory: Normal effort Abdomen: Soft, nontender Back: No CVAT Extremities: No edema Neurologic: Alert and oriented Speculum exam: NEFG; vagina with physiologic discharge, no blood; cervix clean Bimanual exam: cervix closed, no CMT; uterus NSSP; no adnexal tenderness or masses  LAB RESULTS  Results for orders placed in visit on 11/17/12 (from the past 24 hour(s))  POCT PREGNANCY, URINE     Status: Normal   Collection Time   11/17/12  1:58 PM      Component Value Range   Preg Test, Ur NEGATIVE  NEGATIVE     ASSESSMENT 1. Routine gynecological examination   2. Pelvic pain in female   Pain may be related to adhesions  PLAN See AVS for patient education. Also continue using condoms if helpful and try alternate positions for intercourse. Discussed using ibuprofen regularly 600 mg q 6h or at least through pre-post menstrual days.  Pap, GC.CT done Return as needed or in 1 year.    Danae Orleans, CNM 11/17/2012 2:11 PM

## 2012-11-17 NOTE — Patient Instructions (Signed)
Pelvic Pain Pelvic pain is pain below the belly button and located between your hips. Acute pain may last a few hours or days. Chronic pelvic pain may last weeks and months. The cause may be different for different types of pain. The pain may be dull or sharp, mild or severe and can interfere with your daily activities. Write down and tell your caregiver:   Exactly where the pain is located.  If it comes and goes or is there all the time.  When it happens (with sex, urination, bowel movement, etc.)  If the pain is related to your menstrual period or stress. Your caregiver will take a full history and do a complete physical exam and Pap test. CAUSES   Painful menstrual periods (dysmenorrhea).  Normal ovulation (Mittelschmertz) that occurs in the middle of the menstrual cycle every month.  The pelvic organs get engorged with blood just before the menstrual period (pelvic congestive syndrome).  Scar tissue from an infection or past surgery (pelvic adhesions).  Cancer of the female pelvic organs. When there is pain with cancer, it has been there for a long time.  The lining of the uterus (endometrium) abnormally grows in places like the pelvis and on the pelvic organs (endometriosis).  A form of endometriosis with the lining of the uterus present inside of the muscle tissue of the uterus (adenomyosis).  Fibroid tumor (noncancerous) in the uterus.  Bladder problems such as infection, bladder spasms of the muscle tissue of the bladder.  Intestinal problems (irritable bowel syndrome, colitis, an ulcer or gastrointestinal infection).  Polyps of the cervix or uterus.  Pregnancy in the tube (ectopic pregnancy).  The opening of the cervix is too small for the menstrual blood to flow through it (cervical stenosis).  Physical or sexual abuse (past or present).  Musculo-skeletal problems from poor posture, problems with the vertebrae of the lower back or the uterine pelvic muscles falling  (prolapse).  Psychological problems such as depression or stress.  IUD (intrauterine device) in the uterus. DIAGNOSIS  Tests to make a diagnosis depends on the type, location, severity and what causes the pain to occur. Tests that may be needed include:  Blood tests.  Urine tests  Ultrasound.  X-rays.  CT Scan.  MRI.  Laparoscopy.  Major surgery. TREATMENT  Treatment will depend on the cause of the pain, which includes:  Prescription or over-the-counter pain medication.  Antibiotics.  Birth control pills.  Hormone treatment.  Nerve blocking injections.  Physical therapy.  Antidepressants.  Counseling with a psychiatrist or psychologist.  Minor or major surgery. HOME CARE INSTRUCTIONS   Only take over-the-counter or prescription medicines for pain, discomfort or fever as directed by your caregiver.  Follow your caregiver's advice to treat your pain.  Rest.  Avoid sexual intercourse if it causes the pain.  Apply warm or cold compresses (which ever works best) to the pain area.  Do relaxation exercises such as yoga or meditation.  Try acupuncture.  Avoid stressful situations.  Try group therapy.  If the pain is because of a stomach/intestinal upset, drink clear liquids, eat a bland light food diet until the symptoms go away. SEEK MEDICAL CARE IF:   You need stronger prescription pain medication.  You develop pain with sexual intercourse.  You have pain with urination.  You develop a temperature of 102 F (38.9 C) with the pain.  You are still in pain after 4 hours of taking prescription medication for the pain.  You need depression medication.    Your IUD is causing pain and you want it removed. SEEK IMMEDIATE MEDICAL CARE IF:  You develop very severe pain or tenderness.  You faint, have chills, severe weakness or dehydration.  You develop heavy vaginal bleeding or passing solid tissue.  You develop a temperature of 102 F (38.9 C)  with the pain.  You have blood in the urine.  You are being physically or sexually abused.  You have uncontrolled vomiting and diarrhea.  You are depressed and afraid of harming yourself or someone else. Document Released: 01/17/2005 Document Revised: 03/03/2012 Document Reviewed: 10/14/2008 ExitCare Patient Information 2013 [employer].  

## 2012-11-26 ENCOUNTER — Telehealth: Payer: Self-pay | Admitting: *Deleted

## 2012-11-26 NOTE — Telephone Encounter (Signed)
Renee [name] called and states she had a physical last week and wants results.

## 2012-11-28 NOTE — Telephone Encounter (Signed)
Called pt and and informed pt that her results came back normal.  Pt stated understanding and did not have any other questions.

## 2014-01-13 ENCOUNTER — Emergency Department (HOSPITAL_COMMUNITY)
Admission: EM | Admit: 2014-01-13 | Discharge: 2014-01-13 | Disposition: A | Discharge status: Home / Self Care | Payer: Self-pay | Attending: Emergency Medicine | Admitting: Emergency Medicine

## 2014-01-13 ENCOUNTER — Emergency Department (HOSPITAL_COMMUNITY): Payer: Self-pay

## 2014-01-13 ENCOUNTER — Encounter (HOSPITAL_COMMUNITY): Payer: Self-pay | Admitting: Emergency Medicine

## 2014-01-13 DIAGNOSIS — M7651 Patellar tendinitis, right knee: Secondary | ICD-10-CM

## 2014-01-13 DIAGNOSIS — M7650 Patellar tendinitis, unspecified knee: Secondary | ICD-10-CM | POA: Insufficient documentation

## 2014-01-13 DIAGNOSIS — Z791 Long term (current) use of non-steroidal anti-inflammatories (NSAID): Secondary | ICD-10-CM | POA: Insufficient documentation

## 2014-01-13 DIAGNOSIS — Z8742 Personal history of other diseases of the female genital tract: Secondary | ICD-10-CM | POA: Insufficient documentation

## 2014-01-13 DIAGNOSIS — M79609 Pain in unspecified limb: Secondary | ICD-10-CM

## 2014-01-13 DIAGNOSIS — F17200 Nicotine dependence, unspecified, uncomplicated: Secondary | ICD-10-CM | POA: Insufficient documentation

## 2014-01-13 MED ORDER — NAPROXEN 500 MG PO TABS: 500.0000 mg | ORAL_TABLET | Freq: Two times a day (BID) | ORAL | Status: DC

## 2014-01-13 MED ORDER — IBUPROFEN 400 MG PO TABS
600.0000 mg | ORAL_TABLET | Freq: Once | ORAL | Status: AC
  Administered 2014-01-13: 600 mg via ORAL
  Filled 2014-01-13 (×2): qty 1

## 2014-01-13 MED ORDER — HYDROCODONE-ACETAMINOPHEN 5-325 MG PO TABS: 1.0000 | ORAL_TABLET | Freq: Four times a day (QID) | ORAL | Status: DC | PRN

## 2014-01-13 NOTE — ED Notes (Signed)
Patient states she started having R knee pain on Sunday.  Patient states the pain in her R leg has now moved all over her leg from thigh to calf.   Patient states she has been using ibuprofen at home, with a little relief.

## 2014-01-13 NOTE — Discharge Instructions (Signed)
Patellar Tendinitis, Jumper's Knee with Rehab Tendinitis is inflammation of a tendon. Tendonitis of the tendon below the kneecap (patella) is known as patellar tendonitis. Patellar tendonitis is a common cause of pain below the kneecap (infrapatella). Patellar tendonitis may involve a tear (strain) in the ligament. Strains are classified into three categories. Grade 1 strains cause pain, but the tendon is not lengthened. Grade 2 strains include a lengthened ligament, due to the ligament being stretched or partially ruptured. With grade 2 strains there is still function, although function may be decreased. Grade 3 strains involve a complete tear of the tendon or muscle, and function is usually impaired. Patellar tendon strains are usually grade 1 or 2.  SYMPTOMS   Pain, tenderness, swelling, warmth, or redness over the patellar tendon (just below the kneecap).  Pain and loss of strength (sometimes), with forcefully straightening the knee (especially when jumping or rising from a seated or squatting position), or bending the knee completely (squatting or kneeling).  Crackling sound (crepitation) when the tendon is moved or touched. CAUSES  Patellar tendonitis is caused by injury to the patellar tendon. The inflammation is the body's healing response. Common causes of injury include:  Stress from a sudden increase in intensity, frequency, or duration of training.  Overuse of the quadriceps thigh muscles and patellar tendon.  Direct hit (trauma) to the knee or patellar tendon. RISK INCREASES WITH:  Sports that require sudden, explosive thigh muscle (quadriceps) contraction, such as jumping, quick starts, or kicking.  Running sports, especially running down hills.  Poor strength and flexibility of the thigh and knee.  Flat feet. PREVENTION  Warm up and stretch properly before activity.  Allow for adequate recovery between workouts.  Maintain physical fitness:  Strength, flexibility, and  endurance.  Cardiovascular fitness.  Protect the knee joint with taping, protective strapping, bracing, or elastic compression bandage.  Wear arch supports (orthotics). PROGNOSIS  If treated properly, patellar tendonitis usually heals within 6 weeks.  RELATED COMPLICATIONS   Longer healing time, if not properly treated or if not given enough time to heal.  Recurring symptoms, if activity is resumed too soon, with overuse, with a direct blow, or when using poor technique.  If untreated, tendon rupture requiring surgery. TREATMENT Treatment first involves the use of ice and medicine, to reduce pain and inflammation. The use of strengthening and stretching exercises may help reduce pain with activity. These exercises may be performed at home or with a therapist. Serious cases of tendonitis may require restraining the knee for 10 to 14 days, to prevent stress on the tendon and to promote healing. Crutches may be used (uncommon) until you can walk without a limp. For cases in which non-surgical treatment is unsuccessful, surgery may be advised, to remove the inflamed tendon lining (sheath). Surgery is rare, and is only advised after at least 6 months of non-surgical treatment. MEDICATION   If pain medicine is needed, nonsteroidal anti-inflammatory medicines (aspirin and ibuprofen), or other minor pain relievers (acetaminophen), are often advised.  Do not take pain medicine for 7 days before surgery.  Prescription pain relievers may be given, if your caregiver thinks they are needed. Use only as directed and only as much as you need. HEAT AND COLD  Cold treatment (icing) should be applied for 10 to 15 minutes every 2 to 3 hours for inflammation and pain, and immediately after activity that aggravates your symptoms. Use ice packs or an ice massage.  Heat treatment may be used before performing stretching and   strengthening activities prescribed by your caregiver, physical therapist, or athletic  trainer. Use a heat pack or a warm water soak. SEEK MEDICAL CARE IF:  Symptoms get worse or do not improve in 2 weeks, despite treatment.  New, unexplained symptoms develop. (Drugs used in treatment may produce side effects.) EXERCISES RANGE OF MOTION (ROM) AND STRETCHING EXERCISES - Patellar Tendinitis (Jumper's Knee) These are some of the initial exercises with which you may start your rehabilitation program, until you see your caregiver again or until your symptoms are resolved. Remember:   Flexible tissue is more tolerant of the stresses placed on it during activities.  Each stretch should be held for 20 to 30 seconds.  A gentle stretching sensation should be felt. STRETCH Hamstrings, Supine  Lie on your back. Loop a belt or towel over the ball of your right / left foot.  Straighten your right / left knee and slowly pull on the belt to raise your leg. Do not allow the right / left knee to bend. Keep your opposite leg flat on the floor.  Raise the leg until you feel a gentle stretch behind your right / left knee or thigh. Hold this position for __________ seconds. Repeat __________ times. Complete this stretch __________ times per day.  STRETCH - Hamstrings, Doorway  Lie on your back with your right / left leg extended and resting on the wall, and the opposite leg flat on the ground through the door. At first, position your bottom farther away from the wall.  Keep your right / left knee straight. If you feel a stretch behind your knee or thigh, hold this position for __________ seconds.  If you do not feel a stretch, scoot your bottom closer to the door, and hold __________ seconds. Repeat __________ times. Complete this stretch __________ times per day.  STRETCH - Hamstrings, Standing  Stand or sit and extend your right / left leg, placing your foot on a chair or foot stool.  Keep a slight arch in your low back and your hips straight forward.  Lead with your chest and lean  forward at the waist until you feel a gentle stretch in the back of your right / left knee or thigh. (When done correctly, this exercise requires leaning only a small distance.)  Hold this position for __________ seconds. Repeat __________ times. Complete this stretch __________ times per day. STRETCH - Adductors, Lunge  While standing, spread your legs, with your right / left leg behind you.  Lean away from your right / left leg by bending your opposite knee. You may rest your hands on your thigh for balance.  You should feel a stretch in your right / left inner thigh. Hold for __________ seconds. Repeat __________ times. Complete this exercise __________ times per day.  STRENGTHENING EXERCISES - Patellar Tendinitis (Jumper's Knee) These exercises may help you when beginning to rehabilitate your injury. They may resolve your symptoms with or without further involvement from your physician, physical therapist or athletic trainer. While completing these exercises, remember:   Muscles can gain both the endurance and the strength needed for everyday activities through controlled exercises.  Complete these exercises as instructed by your physician, physical therapist or athletic trainer. Increase the resistance and repetitions only as guided by your caregiver. STRENGTH - Quadriceps, Isometrics  Lie on your back with your right / left leg extended and your opposite knee bent.  Gradually tense the muscles in the front of your right / left thigh. You should   see either your kneecap slide up toward your hip or increased dimpling just above the knee. This motion will push the back of the knee down toward the floor, mat, or bed on which you are lying.  Hold the muscle as tight as you can, without increasing your pain, for __________ seconds.  Relax the muscles slowly and completely in between each repetition. Repeat __________ times. Complete this exercise __________ times per day.  STRENGTH -  Quadriceps, Short Arcs  Lie on your back. Place a __________ inch towel roll under your right / left knee, so that the knee bends slightly.  Raise only your lower leg by tightening the muscles in the front of your thigh. Do not allow your thigh to rise.  Hold this position for __________ seconds. Repeat __________ times. Complete this exercise __________ times per day.  OPTIONAL ANKLE WEIGHTS: Begin with ____________________, but DO NOT exceed ____________________. Increase in 1 pound/ 0.5 kilogram increments. STRENGTH - Quadriceps, Straight Leg Raises  Quality counts! Watch for signs that the quadriceps muscle is working, to be sure you are strengthening the correct muscles and not "cheating" by substituting with healthier muscles.  Lay on your back with your right / left leg extended and your opposite knee bent.  Tense the muscles in the front of your right / left thigh. You should see either your kneecap slide up or increased dimpling just above the knee. Your thigh may even shake a bit.  Tighten these muscles even more and raise your leg 4 to 6 inches off the floor. Hold for __________ seconds.  Keeping these muscles tense, lower your leg.  Relax the muscles slowly and completely between each repetition. Repeat __________ times. Complete this exercise __________ times per day.  STRENGTH  Quadriceps, Squats  Stand in a door frame so that your feet and knees are in line with the frame.  Use your hands for balance, not support, on the frame.  Slowly lower your weight, bending at the hips and knees. Keep your lower legs upright so that they are parallel with the door frame. Squat only within the range that does not increase your knee pain. Never let your hips drop below your knees.  Slowly return upright, pushing with your legs, not pulling with your hands. Repeat __________ times. Complete this exercise __________ times per day.  STRENGTH  Quadriceps, Step-Downs  Stand on the edge  of a step stool or stair. Be prepared to use a countertop or wall for balance, if needed.  Keeping your right / left knee directly over the middle of your foot, slowly touch your opposite heel to the floor or lower step. Do not go all the way to the floor if your knee pain increases, just go as far as you can without increased discomfort. Use your right / left leg muscles, not gravity to lower your body weight.  Slowly push your body weight back up to the starting position, Repeat __________ times. Complete this exercise __________ times per day.  Document Released: 12/10/2005 Document Revised: 03/03/2012 Document Reviewed: 03/24/2009 Exit[employer] Patient Information 2014 [employer].  

## 2014-01-13 NOTE — ED Notes (Signed)
Patient states she is unable to bend the right knee due to the pain. She states she was a church and it just started to hurt and is progressively getting worse and moving up her thigh. Patient has been taking Ibuprofen for the pain.

## 2014-01-13 NOTE — Progress Notes (Signed)
VASCULAR LAB PRELIMINARY  PRELIMINARY  PRELIMINARY  PRELIMINARY  Right lower extremity venous duplex completed.    Preliminary report:  Right:  No evidence of DVT, superficial thrombosis, or Baker's cyst.  Rod Majerus, RVS 01/13/2014, 3:13 PM

## 2014-01-13 NOTE — ED Provider Notes (Signed)
Medical screening examination/treatment/procedure(s) were performed by non-physician practitioner and as supervising physician I was immediately available for consultation/collaboration.  EKG Interpretation   None         Charles B. Karle Starch, MD 01/13/14 2052

## 2014-01-13 NOTE — ED Provider Notes (Signed)
4:47 PM  Patient signed out to me at shift change. Patient with knee pain since Saturday, which is 4 days ago. She denies any trauma or injuries to the knee. She denies any similar pain in the past. She states pain is in the front of the knee, worse with movement of the joints, denies any pain with bearing weight. States she's unable to cooperate down the stairs.   Patient's evaluation included venous Doppler of the leg which was negative, x-ray which was negative as well.  I did briefly examine patient, and I do suspect she probably has patella tendinitis. I have given her an Ace wrap, crutches for comfort. Will start an anti-inflammatory, Naprosyn. I have told her I was going to give her some stronger pain medicine just for breakthrough pain. Will give her referral to orthopedics Dr. if this is not improving. At home she is to apply ice, stay off of the leg as long as much is possible, avoid stairs or inclines surfaces. She is to followup.  Renold Genta, PA-C 01/13/14 1649

## 2014-02-19 NOTE — ED Provider Notes (Signed)
CSN: [id number]     Arrival date & time 01/13/14  1251 History   First MD Initiated Contact with Patient 01/13/14 1335     Chief Complaint  Patient presents with  . Leg Pain     (Consider location/radiation/quality/duration/timing/severity/associated sxs/prior Treatment) HPI Comments: Patient presents today with a chief complaint of pain of the right knee and pain of the right calf.  Pain has been present for the past 4 days and is gradually worsening.  She denies any injury or trauma.  She reports that she has noticed some associated swelling, but no erythema or warmth.  She denies fever or chills.  Denies numbness or tingling.  She denies any prior history of DVT or PE.  Denies prolonged travel or surgeries in the past 4 weeks.  Denies use of estrogen containing medications.    The history is provided by the patient.    Past Medical History  Diagnosis Date  . No pertinent past medical history   . Pelvic pain in female    Past Surgical History  Procedure Laterality Date  . Endometrial ablation    . Cesarean section    . Tubal ligation     Family History  Problem Relation Age of Onset  . Other Neg Hx   . Heart disease Mother   . Heart disease Father   . Heart disease Brother   . Diabetes Brother    History  Substance Use Topics  . Smoking status: Current Every Day Smoker -- 0.25 packs/day    Types: Cigarettes    Last Attempt to Quit: 11/11/2012  . Smokeless tobacco: Never Used  . Alcohol Use: No   OB History   Grav Para Term Preterm Abortions TAB SAB Ect Mult Living   2 2 2       2      Review of Systems  Constitutional: Negative for fever and chills.  Musculoskeletal: Negative for back pain and joint swelling.  Skin: Negative for color change.      Allergies  Morphine and related  Home Medications   Current Outpatient Rx  Name  Route  Sig  Dispense  Refill  . EVENING PRIMROSE OIL PO   Oral   Take 1 capsule by mouth every other day.          .  ibuprofen (ADVIL,MOTRIN) 200 MG tablet   Oral   Take 800 mg by mouth once. For pain         . HYDROcodone-acetaminophen (NORCO) 5-325 MG per tablet   Oral   Take 1 tablet by mouth every 6 (six) hours as needed for moderate pain.   10 tablet   0   . naproxen (NAPROSYN) 500 MG tablet   Oral   Take 1 tablet (500 mg total) by mouth 2 (two) times daily.   30 tablet   0    BP 124/70  Pulse 96  Temp(Src) 98.1 F (36.7 C) (Oral)  Resp 16  SpO2 98%  LMP 12/24/2013 Physical Exam  Nursing note and vitals reviewed. Constitutional: She appears well-developed and well-nourished.  HENT:  Head: Normocephalic and atraumatic.  Neck: Normal range of motion. Neck supple.  Cardiovascular: Normal rate, regular rhythm and normal heart sounds.   Pulses:      Dorsalis pedis pulses are 2+ on the right side, and 2+ on the left side.  Pulmonary/Chest: Effort normal and breath sounds normal.  Musculoskeletal: Normal range of motion. She exhibits no edema.  Right knee: She exhibits normal range of motion, no swelling, no deformity, no erythema, no LCL laxity and no MCL laxity. Tenderness found. Medial joint line and lateral joint line tenderness noted.  Tenderness to palpation of the right knee and the right calf.  No edema or erythema.    Neurological: She is alert. No sensory deficit.  Distal sensation of both feet intact.  Skin: Skin is warm and dry. No erythema.  Psychiatric: She has a normal mood and affect.    ED Course  Procedures (including critical care time) Labs Review Labs Reviewed - No data to display Imaging Review No results found.  EKG Interpretation  None  4:00 PM Patient signed out at shift change.  Xray pending.    MDM   Final diagnoses:  Patellar tendonitis of right knee   Patient presenting with right knee and right calf pain.  No injury or trauma.  Doppler ultrasound of right lower extremity negative for DVT.  Xray pending.      Hyman Bible,  PA-C 02/19/14 1819

## 2014-02-19 NOTE — ED Provider Notes (Signed)
Medical screening examination/treatment/procedure(s) were performed by non-physician practitioner and as supervising physician I was immediately available for consultation/collaboration.   Kathalene Frames, MD 02/19/14 [phone]

## 2014-03-24 ENCOUNTER — Emergency Department (HOSPITAL_COMMUNITY): Payer: Self-pay

## 2014-03-24 ENCOUNTER — Emergency Department (HOSPITAL_COMMUNITY)
Admission: EM | Admit: 2014-03-24 | Discharge: 2014-03-24 | Disposition: A | Discharge status: Home / Self Care | Payer: Self-pay | Attending: Emergency Medicine | Admitting: Emergency Medicine

## 2014-03-24 ENCOUNTER — Encounter (HOSPITAL_COMMUNITY): Payer: Self-pay | Admitting: Emergency Medicine

## 2014-03-24 DIAGNOSIS — Z8742 Personal history of other diseases of the female genital tract: Secondary | ICD-10-CM | POA: Insufficient documentation

## 2014-03-24 DIAGNOSIS — Z79899 Other long term (current) drug therapy: Secondary | ICD-10-CM | POA: Insufficient documentation

## 2014-03-24 DIAGNOSIS — R51 Headache: Secondary | ICD-10-CM | POA: Insufficient documentation

## 2014-03-24 DIAGNOSIS — F17200 Nicotine dependence, unspecified, uncomplicated: Secondary | ICD-10-CM | POA: Insufficient documentation

## 2014-03-24 DIAGNOSIS — R071 Chest pain on breathing: Secondary | ICD-10-CM | POA: Insufficient documentation

## 2014-03-24 DIAGNOSIS — R519 Headache, unspecified: Secondary | ICD-10-CM

## 2014-03-24 DIAGNOSIS — R110 Nausea: Secondary | ICD-10-CM | POA: Insufficient documentation

## 2014-03-24 LAB — CBC
HCT: 29.6 % — ABNORMAL LOW (ref 36.0–46.0)
Hemoglobin: 8.9 g/dL — ABNORMAL LOW (ref 12.0–15.0)
MCH: 21.2 pg — AB (ref 26.0–34.0)
MCHC: 30.1 g/dL (ref 30.0–36.0)
MCV: 70.6 fL — AB (ref 78.0–100.0)
PLATELETS: 337 10*3/uL (ref 150–400)
RBC: 4.19 MIL/uL (ref 3.87–5.11)
RDW: 18.2 % — ABNORMAL HIGH (ref 11.5–15.5)
WBC: 7.8 10*3/uL (ref 4.0–10.5)

## 2014-03-24 LAB — BASIC METABOLIC PANEL
BUN: 9 mg/dL (ref 6–23)
CALCIUM: 8.6 mg/dL (ref 8.4–10.5)
CO2: 24 mEq/L (ref 19–32)
CREATININE: 0.69 mg/dL (ref 0.50–1.10)
Chloride: 104 mEq/L (ref 96–112)
GFR calc Af Amer: >90 mL/min (ref >90)
Glucose, Bld: 86 mg/dL (ref 70–99)
Potassium: 4.2 mEq/L (ref 3.7–5.3)
Sodium: 139 mEq/L (ref 137–147)

## 2014-03-24 LAB — I-STAT TROPONIN, ED: Troponin i, poc: 0 ng/mL (ref 0.00–0.08)

## 2014-03-24 LAB — PRO B NATRIURETIC PEPTIDE: PRO B NATRI PEPTIDE: 118.2 pg/mL (ref 0–125)

## 2014-03-24 MED ORDER — SODIUM CHLORIDE 0.9 % IV BOLUS (SEPSIS): 1000.0000 mL | Freq: Once | INTRAVENOUS | Status: DC

## 2014-03-24 MED ORDER — DIPHENHYDRAMINE HCL 50 MG/ML IJ SOLN
25.0000 mg | Freq: Once | INTRAMUSCULAR | Status: DC
  Filled 2014-03-24: qty 1

## 2014-03-24 MED ORDER — DEXAMETHASONE SODIUM PHOSPHATE 10 MG/ML IJ SOLN
10.0000 mg | Freq: Once | INTRAMUSCULAR | Status: DC
  Filled 2014-03-24: qty 1

## 2014-03-24 MED ORDER — METOCLOPRAMIDE HCL 5 MG/ML IJ SOLN
10.0000 mg | Freq: Once | INTRAMUSCULAR | Status: DC
  Filled 2014-03-24: qty 2

## 2014-03-24 NOTE — ED Notes (Addendum)
Pt in c/o chest pain and headache since 2 pm, states pain is intermittent and has gotten progressively worse during the day, also nausea, pain radiates into left arm, denies chest pain at this time but c/o headache- also c/o bilateral ankle and foot swelling recently

## 2014-03-24 NOTE — ED Provider Notes (Signed)
CSN: [id number]     Arrival date & time 03/24/14  1934 History   First MD Initiated Contact with Patient 03/24/14 2204     Chief Complaint  Patient presents with  . Chest Pain  . Headache     (Consider location/radiation/quality/duration/timing/severity/associated sxs/prior Treatment) HPI History provided by pt.   Pt presents w/ multiple complaints.  Has had a headache since 2pm.  Onset gradual while driving in her car, constant and severe ever since.  Located on both sides of head as well as the face.  Associated w/ nausea.  Denies fever, dizziness, vision changes, extremity weakness/paresthesias.  Denies trauma.  No prior h/o same.  Did not take anything for pain.  Also c/o intermittent sharp pains in L anterior chest since this afternoon.  No aggravating/alleviating factors or associated cough, SOB, palpitations, abd pain, LE edema/pain.  No trauma but started new work out routine last week.  No pertinent PMH.  No RF for PE.   Past Medical History  Diagnosis Date  . No pertinent past medical history   . Pelvic pain in female    Past Surgical History  Procedure Laterality Date  . Endometrial ablation    . Cesarean section    . Tubal ligation     Family History  Problem Relation Age of Onset  . Other Neg Hx   . Heart disease Mother   . Heart disease Father   . Heart disease Brother   . Diabetes Brother    History  Substance Use Topics  . Smoking status: Current Every Day Smoker -- 0.25 packs/day    Types: Cigarettes    Last Attempt to Quit: 11/11/2012  . Smokeless tobacco: Never Used  . Alcohol Use: No   OB History   Grav Para Term Preterm Abortions TAB SAB Ect Mult Living   2 2 2       2      Review of Systems  All other systems reviewed and are negative.      Allergies  Shellfish allergy and Morphine and related  Home Medications   Current Outpatient Rx  Name  Route  Sig  Dispense  Refill  . EVENING PRIMROSE OIL PO   Oral   Take 1 capsule by mouth every  morning.          Marland Kitchen ibuprofen (ADVIL,MOTRIN) 200 MG tablet   Oral   Take 1,000 mg by mouth once as needed for moderate pain.          BP 151/92  Pulse 81  Temp(Src) 98.8 F (37.1 C) (Oral)  Resp 18  SpO2 100%  LMP 03/22/2014 Physical Exam  Nursing note and vitals reviewed. Constitutional: She is oriented to person, place, and time. She appears well-developed and well-nourished. No distress.  HENT:  Head: Normocephalic and atraumatic.  Eyes:  Normal appearance  Neck: Normal range of motion.  No meningismus  Cardiovascular: Normal rate, regular rhythm and intact distal pulses.   Pulmonary/Chest: Effort normal and breath sounds normal. No respiratory distress. She exhibits no tenderness.  Abdominal: Soft. Bowel sounds are normal. She exhibits no distension. There is no tenderness.  Musculoskeletal: Normal range of motion.  No LE edema or calf ttp  Neurological: She is alert and oriented to person, place, and time. No sensory deficit. Coordination normal.  CN 3-12 intact.  No nystagmus. 5/5 and equal upper and lower extremity strength.  No past pointing.     Skin: Skin is warm and dry. No rash noted.  Psychiatric: She has a normal mood and affect. Her behavior is normal.    ED Course  Procedures (including critical care time) Labs Review Labs Reviewed  CBC - Abnormal; Notable for the following:    Hemoglobin 8.9 (*)    HCT 29.6 (*)    MCV 70.6 (*)    MCH 21.2 (*)    RDW 18.2 (*)    All other components within normal limits  BASIC METABOLIC PANEL  PRO B NATRIURETIC PEPTIDE  I-STAT TROPOININ, ED   Imaging Review Dg Chest 2 View  03/24/2014   CLINICAL DATA:  Left chest pain radiating to the shoulder  EXAM: CHEST  2 VIEW  COMPARISON:  Prior chest x-ray 12/29/2010  FINDINGS: The lungs are clear and negative for focal airspace consolidation, pulmonary edema or suspicious pulmonary nodule. Stable mild bronchitic changes. No pleural effusion or pneumothorax. Cardiac and  mediastinal contours are within normal limits. No acute fracture or lytic or blastic osseous lesions. The visualized upper abdominal bowel gas pattern is unremarkable.  IMPRESSION: No active cardiopulmonary disease.   Electronically Signed   By: Jacqulynn Cadet M.D.   On: 03/24/2014 20:08     EKG Interpretation None      MDM   Final diagnoses:  Headache    Healthy 38yo F presents w/ non-traumatic headache since 2pm today.  Gradual in onset.  No fever, non-toxic and comfortable appearing, no focal neuro deficits or meningismus on exam.  Will treat symptomatically w/ IVF, reglan, decadron and benadryl and reassess shortly.  Also c/o intermittent sharp pains in L anterior chest this afternoon.  No pain currently.  No associated sx.  No RF for PE.  EKG non-ischemic and troponin and CXR negative.  Pain likely musculoskeletal; started working with a Physiological scientist last week.  10:58 PM    Pt declines headache.  She reports that the pain is not bad enough to require parenteral analgesics and she would prefer to pick up excedrin on her way home.  Wanted to be sure there was no reason to be concerned about the headache.  She has been reassured. Return precautions discussed. 5:14 AM   Remer Macho, PA-C 03/25/14 [phone]

## 2014-03-24 NOTE — Discharge Instructions (Signed)
Take excedrin as needed for headache.  If your headaches become recurrent, please follow up with one of the two neurology groups you have been referred to.  You should return to the ER if your headache worsens or you develop associated fever, vision changes, dizziness, difficulty with speech, swallowing or walking, or numbness/ weakness of your arms or legs.

## 2014-03-24 NOTE — ED Notes (Signed)
Pt refusing IV and medications.  Asking for "a pill"

## 2014-03-25 NOTE — ED Provider Notes (Signed)
Medical screening examination/treatment/procedure(s) were performed by non-physician practitioner and as supervising physician I was immediately available for consultation/collaboration.   EKG Interpretation None        Wandra Arthurs, MD 03/25/14 1120

## 2014-10-25 ENCOUNTER — Encounter (HOSPITAL_COMMUNITY): Payer: Self-pay | Admitting: Emergency Medicine

## 2015-02-01 ENCOUNTER — Other Ambulatory Visit: Payer: Self-pay | Admitting: Family Medicine

## 2015-02-01 DIAGNOSIS — Z1231 Encounter for screening mammogram for malignant neoplasm of breast: Secondary | ICD-10-CM

## 2015-02-08 ENCOUNTER — Ambulatory Visit: Payer: Self-pay

## 2015-02-21 ENCOUNTER — Ambulatory Visit: Payer: Self-pay

## 2015-07-30 NOTE — H&P (Signed)
Renee [name] is an 39 y.o. female with heavy, irregular menstrual bleeding and pelvic pain.  The Patient has h/o of anemia secondary to heavy menstrual bleeding which has required 3 previous blood transfusions.  She had endometrial ablation in 2006 which did lighten her bleeding, however, it has increased in heaviness over time as well as worsening of pain.  SIS shows normal sized uterus with 1.1cm EMS which was difficult to characterize secondary to scarring.  2 small 1 cm intramural fibroids were noted.  Patient has failed hormonal therapy and wishes to proceed with definitive management.    Pertinent Gynecological History: Menses: irregular occurring approximately every 14 days without intermenstrual spotting Bleeding: dysfunctional uterine bleeding Contraception: tubal ligation DES exposure: unknown Blood transfusions: x3 Sexually transmitted diseases: no past history Previous GYN Procedures: C/S x 2, BTL, ablation  Last mammogram: n/a Date: n/a Last pap: normal Date: 01/2015 OB History: G2, P2   Menstrual History: Menarche age: n/a No LMP recorded.    Past Medical History  Diagnosis Date  . No pertinent past medical history   . Pelvic pain in female     Past Surgical History  Procedure Laterality Date  . Endometrial ablation    . Cesarean section    . Tubal ligation      Family History  Problem Relation Age of Onset  . Other Neg Hx   . Heart disease Mother   . Heart disease Father   . Heart disease Brother   . Diabetes Brother     Social History:  reports that she has been smoking Cigarettes.  She has been smoking about 0.25 packs per day. She has never used smokeless tobacco. She reports that she does not drink alcohol or use illicit drugs.  Allergies:  Allergies  Allergen Reactions  . Shellfish Allergy Anaphylaxis and Hives  . Morphine And Related Hives    No prescriptions prior to admission    ROS  There were no vitals taken for this visit. Physical  Exam  Constitutional: She is oriented to person, place, and time. She appears well-developed and well-nourished.  GI: Soft. There is no rebound and no guarding.  Neurological: She is alert and oriented to person, place, and time.  Skin: Skin is warm and dry.  Psychiatric: She has a normal mood and affect. Her behavior is normal.    No results found for this or any previous visit (from the past 24 hour(s)).  No results found.  Assessment/Plan: 38yo with heavy, irregular vaginal bleeding, pelvic pain -LAVH, bilateral salpingectomy.  The patient was counseled re: risk of bleeding, infection, scarring, and damage to surrounding structures.  She understands the risk of converting to abdominal hysterectomy.  All questions were answered and the patient wishes to proceed.  Rashawd Laskaris, [place] 07/30/2015, 4:37 PM

## 2015-08-01 ENCOUNTER — Encounter (HOSPITAL_COMMUNITY)
Admission: RE | Admit: 2015-08-01 | Discharge: 2015-08-01 | Disposition: A | Discharge status: Home / Self Care | Payer: 59 | Source: Ambulatory Visit | Attending: Obstetrics & Gynecology | Admitting: Obstetrics & Gynecology

## 2015-08-01 ENCOUNTER — Encounter (HOSPITAL_COMMUNITY): Payer: Self-pay

## 2015-08-01 DIAGNOSIS — D649 Anemia, unspecified: Secondary | ICD-10-CM | POA: Diagnosis not present

## 2015-08-01 DIAGNOSIS — Z6841 Body Mass Index (BMI) 40.0 and over, adult: Secondary | ICD-10-CM | POA: Diagnosis not present

## 2015-08-01 DIAGNOSIS — N946 Dysmenorrhea, unspecified: Secondary | ICD-10-CM | POA: Diagnosis not present

## 2015-08-01 DIAGNOSIS — N938 Other specified abnormal uterine and vaginal bleeding: Secondary | ICD-10-CM | POA: Diagnosis not present

## 2015-08-01 DIAGNOSIS — N926 Irregular menstruation, unspecified: Secondary | ICD-10-CM | POA: Diagnosis present

## 2015-08-01 DIAGNOSIS — F17210 Nicotine dependence, cigarettes, uncomplicated: Secondary | ICD-10-CM | POA: Diagnosis not present

## 2015-08-01 DIAGNOSIS — R102 Pelvic and perineal pain: Secondary | ICD-10-CM | POA: Diagnosis not present

## 2015-08-01 DIAGNOSIS — N920 Excessive and frequent menstruation with regular cycle: Secondary | ICD-10-CM | POA: Diagnosis not present

## 2015-08-01 HISTORY — DX: Anemia, unspecified: D64.9

## 2015-08-01 LAB — COMPREHENSIVE METABOLIC PANEL
ALT: 13 U/L — ABNORMAL LOW (ref 14–54)
AST: 18 U/L (ref 15–41)
Albumin: 3.7 g/dL (ref 3.5–5.0)
Alkaline Phosphatase: 57 U/L (ref 38–126)
Anion gap: 7 (ref 5–15)
BUN: 8 mg/dL (ref 6–20)
CO2: 27 mmol/L (ref 22–32)
Calcium: 8.8 mg/dL — ABNORMAL LOW (ref 8.9–10.3)
Chloride: 105 mmol/L (ref 101–111)
Creatinine, Ser: 0.71 mg/dL (ref 0.44–1.00)
GFR calc Af Amer: >60 mL/min (ref >60)
GFR calc non Af Amer: >60 mL/min (ref >60)
Glucose, Bld: 106 mg/dL — ABNORMAL HIGH (ref 65–99)
Potassium: 3.7 mmol/L (ref 3.5–5.1)
Sodium: 139 mmol/L (ref 135–145)
Total Bilirubin: 0.2 mg/dL — ABNORMAL LOW (ref 0.3–1.2)
Total Protein: 7.3 g/dL (ref 6.5–8.1)

## 2015-08-01 LAB — CBC
HEMATOCRIT: 31.8 % — AB (ref 36.0–46.0)
HEMOGLOBIN: 9.6 g/dL — AB (ref 12.0–15.0)
MCH: 21.6 pg — ABNORMAL LOW (ref 26.0–34.0)
MCHC: 30.2 g/dL (ref 30.0–36.0)
MCV: 71.6 fL — ABNORMAL LOW (ref 78.0–100.0)
Platelets: 336 10*3/uL (ref 150–400)
RBC: 4.44 MIL/uL (ref 3.87–5.11)
RDW: 17.8 % — ABNORMAL HIGH (ref 11.5–15.5)
WBC: 8 10*3/uL (ref 4.0–10.5)

## 2015-08-01 LAB — ABO/RH: ABO/RH(D): O POS

## 2015-08-01 LAB — TYPE AND SCREEN
ABO/RH(D): O POS
Antibody Screen: NEGATIVE

## 2015-08-01 NOTE — Patient Instructions (Signed)
Your procedure is scheduled on: August 04, 2015  Enter through the Main Entrance of [facility] at:  6:00 am   Pick up the phone at the desk and dial [phone].  Call this number if you have problems the morning of surgery: [phone].  Remember: Do NOT eat food:  After midnight on Wednesday  Do NOT drink clear liquids after:  Midnight on Wednesday  Take these medicines the morning of surgery with a SIP OF WATER:  None   Do NOT wear jewelry (body piercing), metal hair clips/bobby pins, make-up, or nail polish. Do NOT wear lotions, powders, or perfumes.  You may wear deoderant. Do NOT shave for 48 hours prior to surgery. Do NOT bring valuables to the hospital. Contacts, dentures, or bridgework may not be worn into surgery. Leave suitcase in car.  After surgery it may be brought to your room.  For patients admitted to the hospital, checkout time is 11:00 AM the day of discharge.

## 2015-08-03 NOTE — Anesthesia Preprocedure Evaluation (Addendum)
Anesthesia Evaluation  Patient identified by MRN, date of birth, ID band Patient awake    Reviewed: Allergy & Precautions, NPO status , Patient's Chart, lab work & pertinent test results  History of Anesthesia Complications Negative for: history of anesthetic complications  Airway Mallampati: II  TM Distance: >3 FB Neck ROM: Full    Dental no notable dental hx. (+) Dental Advisory Given   Pulmonary Current Smoker,  breath sounds clear to auscultation  Pulmonary exam normal       Cardiovascular negative cardio ROS Normal cardiovascular examRhythm:Regular Rate:Normal  2011: Stress ECG conclusions: The stress ECG was normal. - Staged echo: Normal echo stress    Neuro/Psych negative neurological ROS  negative psych ROS   GI/Hepatic negative GI ROS, Neg liver ROS,   Endo/Other  Morbid obesity  Renal/GU negative Renal ROS  negative genitourinary   Musculoskeletal negative musculoskeletal ROS (+)   Abdominal (+) + obese,   Peds negative pediatric ROS (+)  Hematology negative hematology ROS (+)   Anesthesia Other Findings   Reproductive/Obstetrics negative OB ROS                          Anesthesia Physical Anesthesia Plan  ASA: III  Anesthesia Plan: General   Post-op Pain Management:    Induction: Intravenous  Airway Management Planned: Oral ETT  Additional Equipment:   Intra-op Plan:   Post-operative Plan: Extubation in OR  Informed Consent: I have reviewed the patients History and Physical, chart, labs and discussed the procedure including the risks, benefits and alternatives for the proposed anesthesia with the patient or authorized representative who has indicated his/her understanding and acceptance.   Dental advisory given  Plan Discussed with: CRNA  Anesthesia Plan Comments: (Ramp positioning for intubation)      Anesthesia Quick Evaluation

## 2015-08-04 ENCOUNTER — Ambulatory Visit (HOSPITAL_COMMUNITY): Payer: 59 | Admitting: Anesthesiology

## 2015-08-04 ENCOUNTER — Encounter (HOSPITAL_COMMUNITY)
Admission: RE | Disposition: A | Discharge status: Home / Self Care | Payer: Self-pay | Source: Ambulatory Visit | Attending: Obstetrics & Gynecology

## 2015-08-04 ENCOUNTER — Encounter (HOSPITAL_COMMUNITY): Payer: Self-pay | Admitting: *Deleted

## 2015-08-04 ENCOUNTER — Observation Stay (HOSPITAL_COMMUNITY)
Admission: RE | Admit: 2015-08-04 | Discharge: 2015-08-05 | Disposition: A | Discharge status: Home / Self Care | Payer: 59 | Source: Ambulatory Visit | Attending: Obstetrics & Gynecology | Admitting: Obstetrics & Gynecology

## 2015-08-04 DIAGNOSIS — Z6841 Body Mass Index (BMI) 40.0 and over, adult: Secondary | ICD-10-CM | POA: Insufficient documentation

## 2015-08-04 DIAGNOSIS — N920 Excessive and frequent menstruation with regular cycle: Secondary | ICD-10-CM | POA: Diagnosis not present

## 2015-08-04 DIAGNOSIS — D649 Anemia, unspecified: Secondary | ICD-10-CM | POA: Insufficient documentation

## 2015-08-04 DIAGNOSIS — Z90710 Acquired absence of both cervix and uterus: Secondary | ICD-10-CM | POA: Diagnosis present

## 2015-08-04 DIAGNOSIS — N938 Other specified abnormal uterine and vaginal bleeding: Secondary | ICD-10-CM | POA: Insufficient documentation

## 2015-08-04 DIAGNOSIS — F17210 Nicotine dependence, cigarettes, uncomplicated: Secondary | ICD-10-CM | POA: Insufficient documentation

## 2015-08-04 DIAGNOSIS — N946 Dysmenorrhea, unspecified: Secondary | ICD-10-CM | POA: Insufficient documentation

## 2015-08-04 DIAGNOSIS — R102 Pelvic and perineal pain: Secondary | ICD-10-CM | POA: Insufficient documentation

## 2015-08-04 DIAGNOSIS — N926 Irregular menstruation, unspecified: Secondary | ICD-10-CM | POA: Insufficient documentation

## 2015-08-04 HISTORY — PX: LAPAROSCOPIC LYSIS OF ADHESIONS: SHX5905

## 2015-08-04 HISTORY — PX: BILATERAL SALPINGECTOMY: SHX5743

## 2015-08-04 HISTORY — PX: LAPAROSCOPIC ASSISTED VAGINAL HYSTERECTOMY: SHX5398

## 2015-08-04 LAB — PREGNANCY, URINE: PREG TEST UR: NEGATIVE

## 2015-08-04 SURGERY — HYSTERECTOMY, VAGINAL, LAPAROSCOPY-ASSISTED
Anesthesia: General | Site: Abdomen

## 2015-08-04 MED ORDER — NEOSTIGMINE METHYLSULFATE 10 MG/10ML IV SOLN
INTRAVENOUS | Status: AC
Start: 2015-08-04 — End: 2015-08-04
  Filled 2015-08-04: qty 1

## 2015-08-04 MED ORDER — DEXTROSE IN LACTATED RINGERS 5 % IV SOLN
INTRAVENOUS | Status: DC
  Administered 2015-08-04 (×2): via INTRAVENOUS

## 2015-08-04 MED ORDER — FENTANYL CITRATE (PF) 250 MCG/5ML IJ SOLN
INTRAMUSCULAR | Status: AC
  Filled 2015-08-04: qty 25

## 2015-08-04 MED ORDER — ROCURONIUM BROMIDE 100 MG/10ML IV SOLN
INTRAVENOUS | Status: DC | PRN
  Administered 2015-08-04: 10 mg via INTRAVENOUS
  Administered 2015-08-04: 50 mg via INTRAVENOUS

## 2015-08-04 MED ORDER — KETOROLAC TROMETHAMINE 30 MG/ML IJ SOLN
30.0000 mg | Freq: Four times a day (QID) | INTRAMUSCULAR | Status: DC
  Administered 2015-08-04 – 2015-08-05 (×3): 30 mg via INTRAVENOUS
  Filled 2015-08-04 (×3): qty 1

## 2015-08-04 MED ORDER — FENTANYL CITRATE (PF) 100 MCG/2ML IJ SOLN
INTRAMUSCULAR | Status: AC
  Administered 2015-08-04: 25 ug via INTRAVENOUS
  Filled 2015-08-04: qty 2

## 2015-08-04 MED ORDER — ONDANSETRON HCL 4 MG/2ML IJ SOLN: 4.0000 mg | Freq: Once | INTRAMUSCULAR | Status: DC | PRN

## 2015-08-04 MED ORDER — MIDAZOLAM HCL 2 MG/2ML IJ SOLN
INTRAMUSCULAR | Status: DC | PRN
  Administered 2015-08-04: 2 mg via INTRAVENOUS

## 2015-08-04 MED ORDER — LACTATED RINGERS IV SOLN
INTRAVENOUS | Status: DC
  Administered 2015-08-04 (×2): via INTRAVENOUS

## 2015-08-04 MED ORDER — HYDROMORPHONE HCL 1 MG/ML IJ SOLN
INTRAMUSCULAR | Status: AC
  Administered 2015-08-04: 0.25 mg via INTRAVENOUS
  Filled 2015-08-04: qty 1

## 2015-08-04 MED ORDER — 0.9 % SODIUM CHLORIDE (POUR BTL) OPTIME
TOPICAL | Status: DC | PRN
  Administered 2015-08-04: 2000 mL

## 2015-08-04 MED ORDER — HYDROMORPHONE HCL 1 MG/ML IJ SOLN
0.2500 mg | INTRAMUSCULAR | Status: DC | PRN
  Administered 2015-08-04: 0.5 mg via INTRAVENOUS
  Administered 2015-08-04 (×2): 0.25 mg via INTRAVENOUS

## 2015-08-04 MED ORDER — MEPERIDINE HCL 25 MG/ML IJ SOLN
50.0000 mg | INTRAMUSCULAR | Status: AC | PRN
  Administered 2015-08-04: 50 mg via INTRAVENOUS
  Filled 2015-08-04: qty 2

## 2015-08-04 MED ORDER — CEFAZOLIN SODIUM-DEXTROSE 2-3 GM-% IV SOLR
2.0000 g | INTRAVENOUS | Status: AC
  Administered 2015-08-04: 2 g via INTRAVENOUS

## 2015-08-04 MED ORDER — ROCURONIUM BROMIDE 100 MG/10ML IV SOLN
INTRAVENOUS | Status: AC
  Filled 2015-08-04: qty 1

## 2015-08-04 MED ORDER — KETOROLAC TROMETHAMINE 30 MG/ML IJ SOLN
INTRAMUSCULAR | Status: DC | PRN
Start: 2015-08-04 — End: 2015-08-04
  Administered 2015-08-04: 30 mg via INTRAVENOUS

## 2015-08-04 MED ORDER — DEXTROSE IN LACTATED RINGERS 5 % IV SOLN: INTRAVENOUS | Status: DC

## 2015-08-04 MED ORDER — OXYCODONE-ACETAMINOPHEN 7.5-325 MG PO TABS
2.0000 | ORAL_TABLET | Freq: Four times a day (QID) | ORAL | Status: DC | PRN
Start: 2015-08-04 — End: 2015-08-05

## 2015-08-04 MED ORDER — DEXAMETHASONE SODIUM PHOSPHATE 4 MG/ML IJ SOLN
INTRAMUSCULAR | Status: AC
  Filled 2015-08-04: qty 1

## 2015-08-04 MED ORDER — KETOROLAC TROMETHAMINE 30 MG/ML IJ SOLN: 30.0000 mg | Freq: Four times a day (QID) | INTRAMUSCULAR | Status: DC

## 2015-08-04 MED ORDER — GLYCOPYRROLATE 0.2 MG/ML IJ SOLN
INTRAMUSCULAR | Status: AC
  Filled 2015-08-04: qty 3

## 2015-08-04 MED ORDER — LIDOCAINE HCL (CARDIAC) 20 MG/ML IV SOLN
INTRAVENOUS | Status: DC | PRN
  Administered 2015-08-04: 100 mg via INTRAVENOUS

## 2015-08-04 MED ORDER — BUPIVACAINE HCL (PF) 0.25 % IJ SOLN
INTRAMUSCULAR | Status: AC
  Filled 2015-08-04: qty 30

## 2015-08-04 MED ORDER — SUCCINYLCHOLINE CHLORIDE 20 MG/ML IJ SOLN
INTRAMUSCULAR | Status: DC | PRN
  Administered 2015-08-04: 120 mg via INTRAVENOUS

## 2015-08-04 MED ORDER — PROPOFOL 10 MG/ML IV BOLUS
INTRAVENOUS | Status: DC | PRN
  Administered 2015-08-04: 200 mg via INTRAVENOUS

## 2015-08-04 MED ORDER — SUCCINYLCHOLINE CHLORIDE 20 MG/ML IJ SOLN
INTRAMUSCULAR | Status: AC
  Filled 2015-08-04: qty 1

## 2015-08-04 MED ORDER — FENTANYL CITRATE (PF) 100 MCG/2ML IJ SOLN
25.0000 ug | INTRAMUSCULAR | Status: DC | PRN
  Administered 2015-08-04: 25 ug via INTRAVENOUS
  Administered 2015-08-04: 50 ug via INTRAVENOUS
  Administered 2015-08-04: 25 ug via INTRAVENOUS

## 2015-08-04 MED ORDER — BUPIVACAINE HCL (PF) 0.25 % IJ SOLN
INTRAMUSCULAR | Status: DC | PRN
  Administered 2015-08-04: 6 mL

## 2015-08-04 MED ORDER — OXYCODONE-ACETAMINOPHEN 7.5-325 MG PO TABS: 1.0000 | ORAL_TABLET | Freq: Four times a day (QID) | ORAL | Status: DC | PRN

## 2015-08-04 MED ORDER — ONDANSETRON HCL 4 MG/2ML IJ SOLN
INTRAMUSCULAR | Status: AC
  Filled 2015-08-04: qty 2

## 2015-08-04 MED ORDER — SIMETHICONE 80 MG PO CHEW
80.0000 mg | CHEWABLE_TABLET | Freq: Four times a day (QID) | ORAL | Status: DC | PRN
  Administered 2015-08-05: 80 mg via ORAL
  Filled 2015-08-04: qty 1

## 2015-08-04 MED ORDER — LIDOCAINE HCL (CARDIAC) 20 MG/ML IV SOLN
INTRAVENOUS | Status: AC
  Filled 2015-08-04: qty 5

## 2015-08-04 MED ORDER — ACETAMINOPHEN 10 MG/ML IV SOLN
1000.0000 mg | Freq: Once | INTRAVENOUS | Status: AC
  Administered 2015-08-04: 1000 mg via INTRAVENOUS
  Filled 2015-08-04: qty 100

## 2015-08-04 MED ORDER — SCOPOLAMINE 1 MG/3DAYS TD PT72
MEDICATED_PATCH | TRANSDERMAL | Status: AC
  Administered 2015-08-04: 1.5 mg via TRANSDERMAL
  Filled 2015-08-04: qty 1

## 2015-08-04 MED ORDER — DEXAMETHASONE SODIUM PHOSPHATE 10 MG/ML IJ SOLN
INTRAMUSCULAR | Status: DC | PRN
  Administered 2015-08-04: 4 mg via INTRAVENOUS

## 2015-08-04 MED ORDER — GLYCOPYRROLATE 0.2 MG/ML IJ SOLN
INTRAMUSCULAR | Status: DC | PRN
  Administered 2015-08-04: 0.6 mg via INTRAVENOUS

## 2015-08-04 MED ORDER — MENTHOL 3 MG MT LOZG: 1.0000 | LOZENGE | OROMUCOSAL | Status: DC | PRN

## 2015-08-04 MED ORDER — NEOSTIGMINE METHYLSULFATE 10 MG/10ML IV SOLN
INTRAVENOUS | Status: DC | PRN
  Administered 2015-08-04: 4 mg via INTRAVENOUS

## 2015-08-04 MED ORDER — CEFAZOLIN SODIUM-DEXTROSE 2-3 GM-% IV SOLR
INTRAVENOUS | Status: AC
  Filled 2015-08-04: qty 50

## 2015-08-04 MED ORDER — ONDANSETRON HCL 4 MG PO TABS: 4.0000 mg | ORAL_TABLET | Freq: Four times a day (QID) | ORAL | Status: DC | PRN

## 2015-08-04 MED ORDER — OXYCODONE-ACETAMINOPHEN 7.5-325 MG PO TABS
2.0000 | ORAL_TABLET | Freq: Four times a day (QID) | ORAL | Status: DC | PRN
  Filled 2015-08-04: qty 2

## 2015-08-04 MED ORDER — DOCUSATE SODIUM 100 MG PO CAPS
100.0000 mg | ORAL_CAPSULE | Freq: Two times a day (BID) | ORAL | Status: DC
  Administered 2015-08-04 – 2015-08-05 (×2): 100 mg via ORAL
  Filled 2015-08-04 (×2): qty 1

## 2015-08-04 MED ORDER — SCOPOLAMINE 1 MG/3DAYS TD PT72
1.0000 | MEDICATED_PATCH | Freq: Once | TRANSDERMAL | Status: DC
  Administered 2015-08-04: 1.5 mg via TRANSDERMAL

## 2015-08-04 MED ORDER — ONDANSETRON HCL 4 MG/2ML IJ SOLN: 4.0000 mg | Freq: Four times a day (QID) | INTRAMUSCULAR | Status: DC | PRN

## 2015-08-04 MED ORDER — PROPOFOL 10 MG/ML IV BOLUS
INTRAVENOUS | Status: AC
  Filled 2015-08-04: qty 20

## 2015-08-04 MED ORDER — OXYCODONE-ACETAMINOPHEN 7.5-325 MG PO TABS
1.0000 | ORAL_TABLET | Freq: Four times a day (QID) | ORAL | Status: DC | PRN
  Administered 2015-08-04: 1 via ORAL
  Filled 2015-08-04: qty 1

## 2015-08-04 MED ORDER — MIDAZOLAM HCL 2 MG/2ML IJ SOLN
INTRAMUSCULAR | Status: AC
  Filled 2015-08-04: qty 4

## 2015-08-04 MED ORDER — ONDANSETRON HCL 4 MG/2ML IJ SOLN
INTRAMUSCULAR | Status: DC | PRN
  Administered 2015-08-04: 4 mg via INTRAVENOUS

## 2015-08-04 MED ORDER — HYDROMORPHONE HCL 1 MG/ML IJ SOLN
0.5000 mg | INTRAMUSCULAR | Status: DC | PRN
  Administered 2015-08-04: 0.5 mg via INTRAVENOUS
  Filled 2015-08-04: qty 1

## 2015-08-04 MED ORDER — KETOROLAC TROMETHAMINE 30 MG/ML IJ SOLN
INTRAMUSCULAR | Status: AC
  Filled 2015-08-04: qty 1

## 2015-08-04 MED ORDER — FENTANYL CITRATE (PF) 100 MCG/2ML IJ SOLN
INTRAMUSCULAR | Status: DC | PRN
  Administered 2015-08-04 (×2): 100 ug via INTRAVENOUS
  Administered 2015-08-04 (×2): 50 ug via INTRAVENOUS
  Administered 2015-08-04: 150 ug via INTRAVENOUS
  Administered 2015-08-04: 50 ug via INTRAVENOUS

## 2015-08-04 SURGICAL SUPPLY — 40 items
CABLE HIGH FREQUENCY MONO STRZ (ELECTRODE) IMPLANT
CANISTER SUCT 3000ML (MISCELLANEOUS) ×3 IMPLANT
CATH ROBINSON RED A/P 16FR (CATHETERS) ×3 IMPLANT
CLOTH BEACON ORANGE TIMEOUT ST (SAFETY) ×3 IMPLANT
COVER BACK TABLE 60X90IN (DRAPES) ×3 IMPLANT
DECANTER SPIKE VIAL GLASS SM (MISCELLANEOUS) IMPLANT
DRSG COVADERM PLUS 2X2 (GAUZE/BANDAGES/DRESSINGS) ×3 IMPLANT
DRSG OPSITE POSTOP 3X4 (GAUZE/BANDAGES/DRESSINGS) ×3 IMPLANT
DURAPREP 26ML APPLICATOR (WOUND CARE) ×3 IMPLANT
ELECT LIGASURE LONG (ELECTRODE) IMPLANT
ELECT REM PT RETURN 9FT ADLT (ELECTROSURGICAL)
ELECTRODE REM PT RTRN 9FT ADLT (ELECTROSURGICAL) IMPLANT
FILTER SMOKE EVAC LAPAROSHD (FILTER) ×3 IMPLANT
FORCEPS CUTTING 45CM 5MM (CUTTING FORCEPS) IMPLANT
GLOVE BIO SURGEON STRL SZ 6 (GLOVE) ×6 IMPLANT
GLOVE BIOGEL PI IND STRL 6 (GLOVE) ×2 IMPLANT
GLOVE BIOGEL PI IND STRL 7.0 (GLOVE) ×2 IMPLANT
GLOVE BIOGEL PI INDICATOR 6 (GLOVE) ×4
GLOVE BIOGEL PI INDICATOR 7.0 (GLOVE) ×4
LIQUID BAND (GAUZE/BANDAGES/DRESSINGS) ×3 IMPLANT
MANIPULATOR UTERINE 4.5 ZUMI (MISCELLANEOUS) IMPLANT
NEEDLE INSUFFLATION 120MM (ENDOMECHANICALS) ×3 IMPLANT
NS IRRIG 1000ML POUR BTL (IV SOLUTION) ×3 IMPLANT
PACK LAVH (CUSTOM PROCEDURE TRAY) ×3 IMPLANT
PACK ROBOTIC GOWN (GOWN DISPOSABLE) ×3 IMPLANT
PAD POSITIONING PINK XL (MISCELLANEOUS) ×3 IMPLANT
SEALER TISSUE G2 CVD JAW 45CM (ENDOMECHANICALS) IMPLANT
SET IRRIG TUBING LAPAROSCOPIC (IRRIGATION / IRRIGATOR) IMPLANT
SLEEVE XCEL OPT CAN 5 100 (ENDOMECHANICALS) ×3 IMPLANT
SUT MNCRL 0 MO-4 VIOLET 18 CR (SUTURE) ×2 IMPLANT
SUT MNCRL AB 3-0 PS2 27 (SUTURE) ×3 IMPLANT
SUT MON AB 2-0 CT1 36 (SUTURE) ×3 IMPLANT
SUT MONOCRYL 0 MO 4 18  CR/8 (SUTURE) ×4
SUT VICRYL 0 TIES 12 18 (SUTURE) ×3 IMPLANT
SUT VICRYL 0 UR6 27IN ABS (SUTURE) ×3 IMPLANT
TOWEL OR 17X24 6PK STRL BLUE (TOWEL DISPOSABLE) ×6 IMPLANT
TRAY FOLEY CATH SILVER 14FR (SET/KITS/TRAYS/PACK) ×3 IMPLANT
TROCAR XCEL NON-BLD 11X100MML (ENDOMECHANICALS) ×3 IMPLANT
TROCAR XCEL NON-BLD 5MMX100MML (ENDOMECHANICALS) ×3 IMPLANT
WARMER LAPAROSCOPE (MISCELLANEOUS) ×3 IMPLANT

## 2015-08-04 NOTE — Transfer of Care (Signed)
Immediate Anesthesia Transfer of Care Note  Patient: Dnasia C Ghazarian  Procedure(s) Performed: Procedure(s): LAPAROSCOPIC ASSISTED VAGINAL HYSTERECTOMY (N/A) LEFT SALPINGECTOMY (N/A) LAPAROSCOPIC LYSIS OF ADHESIONS (N/A)  Patient Location: PACU  Anesthesia Type:General  Level of Consciousness: awake, alert  and oriented  Airway & Oxygen Therapy: Patient Spontanous Breathing and Patient connected to nasal cannula oxygen  Post-op Assessment: Report given to RN and Post -op Vital signs reviewed and stable  Post vital signs: Reviewed and stable  Last Vitals:  Filed Vitals:   08/04/15 0616  BP: 142/85  Pulse: 89  Temp: 36.8 C  Resp: 20    Complications: No apparent anesthesia complications

## 2015-08-04 NOTE — Addendum Note (Signed)
Addendum  created 08/04/15 1537 by Hewitt Blade, CRNA   Modules edited: Notes Section   Notes Section:  File: [id number]

## 2015-08-04 NOTE — Progress Notes (Signed)
Patient c/o back pain not controlled by Dilaudid.  Pt has h/o hives with morphine and reports Dilaudid never works for her pain.  Tolerating clears.  No n/v.  VSS. AF. UOP adequate, clear yellow  Gen: sleepy, uncomfortable Abd: soft, dressings c/d Ext: no c/c/e  POD#0 s/p LAVH -Will change to IV Demerol and give Toradol dose one hour early -Advance diet as tolerated so able to change to po meds -AM labs -D/C foley and IVF in am  Linda Hedges, DO

## 2015-08-04 NOTE — Anesthesia Postprocedure Evaluation (Signed)
  Anesthesia Post-op Note  Patient: Renee [name]  Procedure(s) Performed: Procedure(s): LAPAROSCOPIC ASSISTED VAGINAL HYSTERECTOMY (N/A) LEFT SALPINGECTOMY (N/A) LAPAROSCOPIC LYSIS OF ADHESIONS (N/A)  Patient Location: PACU  Anesthesia Type:General  Level of Consciousness: awake, alert  and oriented  Airway and Oxygen Therapy: Patient Spontanous Breathing and Patient connected to nasal cannula oxygen  Post-op Pain: moderate  Post-op Assessment: Post-op Vital signs reviewed, Patient's Cardiovascular Status Stable, Respiratory Function Stable, Patent Airway, No signs of Nausea or vomiting and Pain level controlled              Post-op Vital Signs: Reviewed and stable  Last Vitals:  Filed Vitals:   08/04/15 1200  BP: 141/77  Pulse: 76  Temp: 37.1 C  Resp: 14    Complications: No apparent anesthesia complications

## 2015-08-04 NOTE — Op Note (Signed)
PROCEDURE DATE: 08/04/2015  PREOPERATIVE DIAGNOSIS: Menorrhagia, dysmenorrhea  POSTOPERATIVE DIAGNOSIS: The same  PROCEDURE: Laparoscopic Assisted Vaginal Hysterectomy, left salpingectomy, lysis of adhesions, back-fill of bladder SURGEON: Dr. Linda Hedges  ASSISTANT: Dr. Evette Cristal INDICATIONS: 39 y.o. G2P2 with menorrhagia and dysmenorrhea desiring definitive surgical management. Risks of surgery were discussed with the patient including but not limited to: bleeding which may require transfusion or reoperation; infection which may require antibiotics; injury to bowel, bladder, ureters or other surrounding organs; need for additional procedures including laparotomy; thromboembolic phenomenon, incisional problems and other postoperative/anesthesia complications. Written informed consent was obtained.  FINDINGS: Small uterus.  Normal ovaries bilaterally.  Right ovary and fallopian tube adherent to anterior abdominal wall. Omental adhesions to left adnexa.  Bladder adhesions to lower uterine segment.  No evidence of endometriosis. Normal upper abdomen.  ANESTHESIA: General  ESTIMATED BLOOD LOSS: 400 ml  SPECIMENS: Uterus and cervix, left fallopian tube COMPLICATIONS: None immediate  PROCEDURE IN DETAIL: The patient received intravenous antibiotics and had sequential compression devices applied to her lower extremities while in the preoperative area. She was then taken to the operating room where general anesthesia was administered and was found to be adequate. She was placed in the dorsal lithotomy position, and was prepped and draped in a sterile manner. An in and out catheterization was performed. A uterine manipulator was then advanced into the uterus . After an adequate timeout was performed, attention was then turned to the patient's abdomen where a 10-mm skin incision was made in the umbilical fold. The Veress needle was carefully introduced into the peritoneal cavity through the abdominal wall.  Intraperitoneal placement was confirmed by drop in intraabdominal pressure with insufflation of carbon dioxide gas. Adequate pneumoperitoneum was obtained, and the 3m trocar and sleeve were then advanced without difficulty into the abdomen where intraabdominal placement was confirmed by the laparoscope. A survey of the patient's pelvis and abdomen revealed the above dictated findings. Suprapubic 544mport was then placed under direct visualization. The pelvis was then carefully examined. On the right side, the fallopian tube was densely adherent to the normal appearing ovary and to the anterior abdominal wall.  The tubo-ovarian bundle was freed from the anterior abdominal wall using the Gyrus. The round ligament was then clamped and transected with the Gyrus. The uteroovarian ligament was also clamped and transected. The leaves of the broad ligament were separated and serially transected.  Attention was turned to the left pelvic.  The omental adhesions were taken down using the Gyrus.  The fallopian tube segment was excised with excellent hemostasis using the Gyrus and was removed from the abdomen.  The round ligament was clamped and cut again with the Gyrus.  The remaining dissection performed on the right was duplicated.  The ureters were noted to be safely away from the area of dissection.  At this point, attention was turned to the vaginal portion of the case. A weighted speculum was placed posteriorly, a Deaver anteriorly, and the cervix grasped with a thyroid tenaculum. Once the anterior and posterior reflections were identified, the cervix was circumscribed using the Bovie knife. Next, using Mayos, the posterior cul-de-sac was entered. The LigaSure was then used to grasp the uterosacrals which were coapted and cut. Next, the bladder reflection was identified. Using Metzenbaums, it was entered and palpation and direct visualization confirmed proper location. Next, using the LigaSure, the uterine arteries  were coapted and cut bilaterally. The pedicles were visualized after coaptation and were hemostatic. The same was performed sequentially cephalad  until the uterus and cervix were removed. The pedicles were inspected and found to be hemostatic. Secondary to the bladder adhesions, the bladder was backfilled using sterile milk and competence was noted.   Next, the uterosacrals were tagged with monocryl bilaterally. The posterior peritoneum was closed using monocryl in a purse-string fashion. The uterosacrals were brought together in the midline cuff closure with a figure-of-8 stitch using monocryl followed by the remainder of the cuff closure in the same fashion. The cuff was inspected and found to be hemostatic.  Attention was returned to the abdomen were a second laparoscopic look was taken. All pedicles were hemostatic.  Insufflation was removed after all instruments were removed.  All skin incisions were closed with 4-0 Vicryl subcuticular stitches. The patient tolerated the procedures well. All instruments, needles, and sponge counts were correct x 2. The patient was taken to the recovery room awake, extubated and in stable condition.

## 2015-08-04 NOTE — Progress Notes (Signed)
Pt sitting in chair eating upon entering room. Pt states pain is 10/10. Offered pt second percocet for pain, pt refused. Pt states percocet does not work for pain control. Pt states that there is no reason to take something that does not work. Pt states Toradol works well for pain; advised pt that next does would not be due until 2030, pt states that she would rather wait for Toradol dose. Will continue to monitor.

## 2015-08-04 NOTE — Progress Notes (Signed)
No change to H&P.

## 2015-08-04 NOTE — Anesthesia Postprocedure Evaluation (Signed)
  Anesthesia Post-op Note  Patient: Renee [name]  Procedure(s) Performed: Procedure(s): LAPAROSCOPIC ASSISTED VAGINAL HYSTERECTOMY (N/A) LEFT SALPINGECTOMY (N/A) LAPAROSCOPIC LYSIS OF ADHESIONS (N/A)  Patient Location: [facility]  Anesthesia Type:General  Level of Consciousness: awake, alert  and oriented  Airway and Oxygen Therapy: Patient Spontanous Breathing  Post-op Pain: none  Post-op Assessment: Post-op Vital signs reviewed and Patient's Cardiovascular Status Stable              Post-op Vital Signs: Reviewed and stable  Last Vitals:  Filed Vitals:   08/04/15 1332  BP: 146/78  Pulse: 66  Temp: 36.9 C  Resp: 16    Complications: No apparent anesthesia complications

## 2015-08-04 NOTE — Anesthesia Procedure Notes (Signed)
Procedure Name: Intubation Performed by: Lauretta Grill Pre-anesthesia Checklist: Patient identified, Emergency Drugs available, Suction available and Patient being monitored Patient Re-evaluated:Patient Re-evaluated prior to inductionOxygen Delivery Method: Circle System Utilized Preoxygenation: Pre-oxygenation with 100% oxygen Intubation Type: IV induction Ventilation: Mask ventilation without difficulty Laryngoscope Size: Mac and 3 Grade View: Grade II Tube type: Oral Number of attempts: 1 Airway Equipment and Method: Stylet and Oral airway Placement Confirmation: ETT inserted through vocal cords under direct vision,  positive ETCO2 and breath sounds checked- equal and bilateral Secured at: 20 cm Tube secured with: Tape Dental Injury: Teeth and Oropharynx as per pre-operative assessment  Comments: Patient positioned on ramp. Easy to mask ventilate. First look by CRNA and unable to visualize anything but epiglottis. Second look by Anesthesiologist and patient's vocal cords were deep but grade II view easily obtained and ETT easily placed. Breath sounds equal and end tidal CO2 observed.

## 2015-08-05 ENCOUNTER — Encounter (HOSPITAL_COMMUNITY): Payer: Self-pay | Admitting: Obstetrics & Gynecology

## 2015-08-05 DIAGNOSIS — N920 Excessive and frequent menstruation with regular cycle: Secondary | ICD-10-CM | POA: Diagnosis not present

## 2015-08-05 LAB — COMPREHENSIVE METABOLIC PANEL
ALBUMIN: 3.2 g/dL — AB (ref 3.5–5.0)
ALK PHOS: 57 U/L (ref 38–126)
ALT: 11 U/L — ABNORMAL LOW (ref 14–54)
AST: 17 U/L (ref 15–41)
Anion gap: 6 (ref 5–15)
BUN: 10 mg/dL (ref 6–20)
CHLORIDE: 106 mmol/L (ref 101–111)
CO2: 27 mmol/L (ref 22–32)
Calcium: 8.7 mg/dL — ABNORMAL LOW (ref 8.9–10.3)
Creatinine, Ser: 0.76 mg/dL (ref 0.44–1.00)
GFR calc Af Amer: >60 mL/min (ref >60)
GFR calc non Af Amer: >60 mL/min (ref >60)
Glucose, Bld: 103 mg/dL — ABNORMAL HIGH (ref 65–99)
Potassium: 3.8 mmol/L (ref 3.5–5.1)
Sodium: 139 mmol/L (ref 135–145)
Total Bilirubin: 0.2 mg/dL — ABNORMAL LOW (ref 0.3–1.2)
Total Protein: 6.8 g/dL (ref 6.5–8.1)

## 2015-08-05 LAB — CBC
HEMATOCRIT: 28.7 % — AB (ref 36.0–46.0)
HEMOGLOBIN: 8.5 g/dL — AB (ref 12.0–15.0)
MCH: 21.3 pg — ABNORMAL LOW (ref 26.0–34.0)
MCHC: 29.6 g/dL — AB (ref 30.0–36.0)
MCV: 71.8 fL — ABNORMAL LOW (ref 78.0–100.0)
Platelets: 307 10*3/uL (ref 150–400)
RBC: 4 MIL/uL (ref 3.87–5.11)
RDW: 17.6 % — ABNORMAL HIGH (ref 11.5–15.5)
WBC: 13.4 10*3/uL — ABNORMAL HIGH (ref 4.0–10.5)

## 2015-08-05 MED ORDER — IBUPROFEN 800 MG PO TABS
800.0000 mg | ORAL_TABLET | Freq: Four times a day (QID) | ORAL | Status: DC | PRN
  Administered 2015-08-05: 800 mg via ORAL
  Filled 2015-08-05: qty 1

## 2015-08-05 MED ORDER — DOCUSATE SODIUM 100 MG PO CAPS: 100.0000 mg | ORAL_CAPSULE | Freq: Two times a day (BID) | ORAL | Status: DC

## 2015-08-05 MED ORDER — IBUPROFEN 800 MG PO TABS: 800.0000 mg | ORAL_TABLET | Freq: Four times a day (QID) | ORAL | Status: DC | PRN

## 2015-08-05 NOTE — Progress Notes (Signed)
No c/o.  Better pain control with Toradol alone.  Narcotics are not effective.  Ambulating well.  Tolerating po.  No flatus.    VSS. AF. Labs stable  Gen: A&O x 3 Abd: soft, ND.  Inc c/d Ext: no c/c/e  POD#1 s/p LAVH -Meeting goals -Cont to advance diet, ambulate -Reevaluate this pm to likely discharge  Linda Hedges, DO

## 2015-08-05 NOTE — Progress Notes (Signed)
Patient discharged home with family.Marland KitchenMarland KitchenDischarge instructions reviewed with patient and she verbalized understanding... Condition stable... No equipment... Taken to car via wheelchair by P. Olevia Bowens, NT.

## 2015-08-05 NOTE — Progress Notes (Signed)
Patient feeling much better.  +Flatus.  Voiding without difficulty.  D/C home.   Linda Hedges, DO

## 2015-08-05 NOTE — Discharge Summary (Signed)
Physician Discharge Summary  Patient ID: [name] MRN: [id number] DOB/AGE: 1976-07-06 39 y.o.  Admit date: 08/04/2015 Discharge date: 08/05/2015  Admission Diagnoses: Heavy vaginal bleeding, dysmenorrhea  Discharge Diagnoses: SAA Active Problems:   S/P hysterectomy   Discharged Condition: good  Hospital Course: Patient was admitted for anticipated LAVH.  The procedure was performed without complication.  Postoperatively, the patient did very well and was meeting all goals by POD#1.  The patient was discharged home with outpt follow-up in 2 weeks.  Consults: None  Significant Diagnostic Studies: n/a  Treatments: surgery: LAVH, left salpingectomy, lysis of adhesions, back-fill of bladder  Discharge Exam: Blood pressure 125/63, pulse 82, temperature 98.4 F (36.9 C), temperature source Oral, resp. rate 18, height 5' 3"  (1.6 m), weight 255 lb (115.667 kg), SpO2 97 %. General appearance: alert, cooperative and appears stated age GI: appropriately ttp Extremities: extremities normal, atraumatic, no cyanosis or edema Incision/Wound:c/d dressing x 2  Disposition: 01-Home or Self Care     Medication List    TAKE these medications        docusate sodium 100 MG capsule  Commonly known as:  COLACE  Take 1 capsule (100 mg total) by mouth 2 (two) times daily.     ibuprofen 800 MG tablet  Commonly known as:  ADVIL,MOTRIN  Take 1 tablet (800 mg total) by mouth every 6 (six) hours as needed for moderate pain.     Magnesium 250 MG Tabs  Take 1 tablet by mouth daily as needed (During menstual cycle.).     Turmeric 500 MG Caps  Take 3 capsules by mouth daily.         SignedLinda Hedges 08/05/2015, 2:18 PM

## 2015-08-05 NOTE — Discharge Instructions (Signed)
Call MD for T>100.4, vaginal bleeding, severe abdominal pain, intractable nausea and/or vomiting, or respiratory distress.  Call office to schedule postop appointment in 2 weeks.  No driving while taking narcotics.  No heavy lifting.  Pelvic rest x 6 weeks.  Continue schedule ibuprofen prn.

## 2015-11-09 ENCOUNTER — Encounter (HOSPITAL_COMMUNITY): Payer: Self-pay | Admitting: Emergency Medicine

## 2015-11-09 ENCOUNTER — Emergency Department (HOSPITAL_COMMUNITY)
Admission: EM | Admit: 2015-11-09 | Discharge: 2015-11-09 | Disposition: A | Discharge status: Home / Self Care | Payer: 59 | Attending: Emergency Medicine | Admitting: Emergency Medicine

## 2015-11-09 ENCOUNTER — Emergency Department (HOSPITAL_COMMUNITY): Payer: 59

## 2015-11-09 DIAGNOSIS — M79672 Pain in left foot: Secondary | ICD-10-CM | POA: Insufficient documentation

## 2015-11-09 DIAGNOSIS — Z79899 Other long term (current) drug therapy: Secondary | ICD-10-CM | POA: Insufficient documentation

## 2015-11-09 DIAGNOSIS — Z862 Personal history of diseases of the blood and blood-forming organs and certain disorders involving the immune mechanism: Secondary | ICD-10-CM | POA: Insufficient documentation

## 2015-11-09 DIAGNOSIS — Z87891 Personal history of nicotine dependence: Secondary | ICD-10-CM | POA: Insufficient documentation

## 2015-11-09 NOTE — ED Notes (Signed)
Patient states foot pain x 3 weeks.   Patient denies injury.   Patient states she just woke up one morning with pain.

## 2015-11-09 NOTE — ED Provider Notes (Signed)
CSN: [id number]     Arrival date & time 11/09/15  1036 History  By signing my name below, I, Essence Howell, attest that this documentation has been prepared under the direction and in the presence of Okey Regal, PA-C Electronically Signed: Ladene Artist, ED Scribe 11/09/2015 at 11:09 AM.   Chief Complaint  Patient presents with  . Foot Pain   The history is provided by the patient. No language interpreter was used.   HPI Comments: Renee [name] is a 39 y.o. female who presents to the Emergency Department complaining of constant, gradually worsening left foot pain for the past 3 weeks. Pt denies injury. She states that she initially noticed pain one morning upon waking. She reports increased pain that she describes as "tenderness" with palpation. Pt is able to ambulate but reports increased pain that travels upward into her lower leg. She denies swelling or numbness. No treatments tried PTA.  Past Medical History  Diagnosis Date  . No pertinent past medical history   . Pelvic pain in female   . Anemia    Past Surgical History  Procedure Laterality Date  . Endometrial ablation    . Cesarean section    . Tubal ligation    . Wisdom tooth extraction    . Laparoscopic assisted vaginal hysterectomy N/A 08/04/2015    Procedure: LAPAROSCOPIC ASSISTED VAGINAL HYSTERECTOMY;  Surgeon: Linda Darrion Wyszynski, DO;  Location: [facility];  Service: Gynecology;  Laterality: N/A;  . Bilateral salpingectomy N/A 08/04/2015    Procedure: LEFT SALPINGECTOMY;  Surgeon: Linda Kylin Genna, DO;  Location: [facility];  Service: Gynecology;  Laterality: N/A;  . Laparoscopic lysis of adhesions N/A 08/04/2015    Procedure: LAPAROSCOPIC LYSIS OF ADHESIONS;  Surgeon: Linda Kassidee Narciso, DO;  Location: [facility];  Service: Gynecology;  Laterality: N/A;   Family History  Problem Relation Age of Onset  . Other Neg Hx   . Heart disease Mother   . Heart disease Father   . Heart disease Brother   . Diabetes Brother    Social History   Substance Use Topics  . Smoking status: Former Smoker -- 0.25 packs/day    Types: Cigarettes    Quit date: 08/03/2015  . Smokeless tobacco: Never Used  . Alcohol Use: No   OB History    Gravida Para Term Preterm AB TAB SAB Ectopic Multiple Living   2 2 2       2      Review of Systems  All other systems reviewed and are negative.  Allergies  Shellfish allergy and Morphine and related  Home Medications   Prior to Admission medications   Medication Sig Start Date End Date Taking? Authorizing Provider  ibuprofen (ADVIL,MOTRIN) 800 MG tablet Take 1 tablet (800 mg total) by mouth every 6 (six) hours as needed for moderate pain. 08/05/15  Yes Megan Morris, DO  Turmeric 500 MG CAPS Take 3 capsules by mouth daily.   Yes Historical Provider, MD  docusate sodium (COLACE) 100 MG capsule Take 1 capsule (100 mg total) by mouth 2 (two) times daily. 08/05/15   Megan Morris, DO  Magnesium 250 MG TABS Take 1 tablet by mouth daily as needed (During menstual cycle.).    Historical Provider, MD   BP 134/79 mmHg  Pulse 85  Temp(Src) 98.5 F (36.9 C) (Oral)  Resp 18  Ht 5' 3"  (1.6 m)  Wt 245 lb (111.131 kg)  BMI 43.41 kg/m2  SpO2 98%  LMP 07/25/2015 (Exact Date)  Physical Exam  Constitutional: She  is oriented to person, place, and time. She appears well-developed and well-nourished. No distress.  HENT:  Head: Normocephalic and atraumatic.  Eyes: Conjunctivae and EOM are normal.  Neck: Neck supple. No tracheal deviation present.  Cardiovascular: Normal rate.   Pulmonary/Chest: Effort normal. No respiratory distress.  Musculoskeletal: Normal range of motion.  Feet equal and bilateral. No obvious swelling or deformity. Tenderness to palpation of the L foot between the 4th and 5th metatarsals. No signs of infection. No pain to palpation to the planta aspect of the foot. Ankle supple with FROM. No edema. LE equal, bilateral. No edema.   Neurological: She is alert and oriented to person, place,  and time.  Skin: Skin is warm and dry.  Psychiatric: She has a normal mood and affect. Her behavior is normal.  Nursing note and vitals reviewed.  ED Course  Procedures (including critical care time) DIAGNOSTIC STUDIES: Oxygen Saturation is 98% on RA, normal by my interpretation.    COORDINATION OF CARE: 11:05 AM-Discussed treatment plan which includes XR with pt at bedside and pt agreed to plan.   Labs Review Labs Reviewed - No data to display  Imaging Review Dg Foot Complete Left  11/09/2015  CLINICAL DATA:  Pain in the third through fifth metatarsals for 1 month, initial encounter, no known injury EXAM: LEFT FOOT - COMPLETE 3+ VIEW no acute abnormality noted. COMPARISON:  None. FINDINGS: There is no evidence of fracture or dislocation. There is no evidence of arthropathy or other focal bone abnormality. Soft tissues are unremarkable. IMPRESSION: Negative. Electronically Signed   By: Inez Catalina M.D.   On: 11/09/2015 11:42   I have personally reviewed and evaluated these images and lab results as part of my medical decision-making.   EKG Interpretation None      MDM   Final diagnoses:  Left foot pain   Labs:  Imaging: DG foot complete left  Consults:  Therapeutics:  Discharge Meds:   Assessment/Plan: Patient X-Ray negative for obvious fracture or dislocation. Pain managed in ED. Pt advised to follow up with orthopedics if symptoms persist for possibility of missed fracture diagnosis. Patient given brace while in ED, conservative therapy recommended and discussed. Patient will be dc home & is agreeable with above plan.   I personally performed the services described in this documentation, which was scribed in my presence. The recorded information has been reviewed and is accurate.    Okey Regal, PA-C 11/09/15 [place], MD 11/10/15 [phone]

## 2015-11-09 NOTE — Discharge Instructions (Signed)
Cryotherapy Cryotherapy means treatment with cold. Ice or gel packs can be used to reduce both pain and swelling. Ice is the most helpful within the first 24 to 48 hours after an injury or flare-up from overusing a muscle or joint. Sprains, strains, spasms, burning pain, shooting pain, and aches can all be eased with ice. Ice can also be used when recovering from surgery. Ice is effective, has very few side effects, and is safe for most people to use. PRECAUTIONS  Ice is not a safe treatment option for people with:  Raynaud phenomenon. This is a condition affecting small blood vessels in the extremities. Exposure to cold may cause your problems to return.  Cold hypersensitivity. There are many forms of cold hypersensitivity, including:  Cold urticaria. Red, itchy hives appear on the skin when the tissues begin to warm after being iced.  Cold erythema. This is a red, itchy rash caused by exposure to cold.  Cold hemoglobinuria. Red blood cells break down when the tissues begin to warm after being iced. The hemoglobin that carry oxygen are passed into the urine because they cannot combine with blood proteins fast enough.  Numbness or altered sensitivity in the area being iced. If you have any of the following conditions, do not use ice until you have discussed cryotherapy with your caregiver:  Heart conditions, such as arrhythmia, angina, or chronic heart disease.  High blood pressure.  Healing wounds or open skin in the area being iced.  Current infections.  Rheumatoid arthritis.  Poor circulation.  Diabetes. Ice slows the blood flow in the region it is applied. This is beneficial when trying to stop inflamed tissues from spreading irritating chemicals to surrounding tissues. However, if you expose your skin to cold temperatures for too long or without the proper protection, you can damage your skin or nerves. Watch for signs of skin damage due to cold. HOME CARE INSTRUCTIONS Follow  these tips to use ice and cold packs safely.  Place a dry or damp towel between the ice and skin. A damp towel will cool the skin more quickly, so you may need to shorten the time that the ice is used.  For a more rapid response, add gentle compression to the ice.  Ice for no more than 10 to 20 minutes at a time. The bonier the area you are icing, the less time it will take to get the benefits of ice.  Check your skin after 5 minutes to make sure there are no signs of a poor response to cold or skin damage.  Rest 20 minutes or more between uses.  Once your skin is numb, you can end your treatment. You can test numbness by very lightly touching your skin. The touch should be so light that you do not see the skin dimple from the pressure of your fingertip. When using ice, most people will feel these normal sensations in this order: cold, burning, aching, and numbness.  Do not use ice on someone who cannot communicate their responses to pain, such as small children or people with dementia. HOW TO MAKE AN ICE PACK Ice packs are the most common way to use ice therapy. Other methods include ice massage, ice baths, and cryosprays. Muscle creams that cause a cold, tingly feeling do not offer the same benefits that ice offers and should not be used as a substitute unless recommended by your caregiver. To make an ice pack, do one of the following:  Place crushed ice or a  bag of frozen vegetables in a sealable plastic bag. Squeeze out the excess air. Place this bag inside another plastic bag. Slide the bag into a pillowcase or place a damp towel between your skin and the bag.  Mix 3 parts water with 1 part rubbing alcohol. Freeze the mixture in a sealable plastic bag. When you remove the mixture from the freezer, it will be slushy. Squeeze out the excess air. Place this bag inside another plastic bag. Slide the bag into a pillowcase or place a damp towel between your skin and the bag. SEEK MEDICAL CARE  IF:  You develop white spots on your skin. This may give the skin a blotchy (mottled) appearance.  Your skin turns blue or pale.  Your skin becomes waxy or hard.  Your swelling gets worse. MAKE SURE YOU:   Understand these instructions.  Will watch your condition.  Will get help right away if you are not doing well or get worse.   This information is not intended to replace advice given to you by your health care provider. Make sure you discuss any questions you have with your health care provider.   Document Released: 08/06/2011 Document Revised: 12/31/2014 Document Reviewed: 08/06/2011 Elsevier Interactive Patient Education [employer].   Please read attached information. If you experience any new or worsening signs or symptoms please return to the emergency room for evaluation. Please follow-up with your primary care provider or specialist as discussed. Please use medication prescribed only as directed and discontinue taking if you have any concerning signs or symptoms.

## 2017-02-03 ENCOUNTER — Encounter (HOSPITAL_COMMUNITY): Payer: Self-pay | Admitting: Emergency Medicine

## 2017-02-03 ENCOUNTER — Ambulatory Visit (HOSPITAL_COMMUNITY)
Admission: EM | Admit: 2017-02-03 | Discharge: 2017-02-03 | Disposition: A | Discharge status: Home / Self Care | Payer: Self-pay | Attending: Internal Medicine | Admitting: Internal Medicine

## 2017-02-03 DIAGNOSIS — S76911A Strain of unspecified muscles, fascia and tendons at thigh level, right thigh, initial encounter: Secondary | ICD-10-CM

## 2017-02-03 MED ORDER — CYCLOBENZAPRINE HCL 10 MG PO TABS: 10.0000 mg | ORAL_TABLET | Freq: Three times a day (TID) | ORAL | 0 refills | Status: AC | PRN

## 2017-02-03 MED ORDER — KETOROLAC TROMETHAMINE 60 MG/2ML IM SOLN
INTRAMUSCULAR | Status: AC
  Filled 2017-02-03: qty 2

## 2017-02-03 MED ORDER — KETOROLAC TROMETHAMINE 60 MG/2ML IM SOLN
60.0000 mg | Freq: Once | INTRAMUSCULAR | Status: AC
  Administered 2017-02-03: 60 mg via INTRAMUSCULAR

## 2017-02-03 MED ORDER — TRAMADOL HCL 50 MG PO TABS: 50.0000 mg | ORAL_TABLET | Freq: Four times a day (QID) | ORAL | 0 refills | Status: DC | PRN

## 2017-02-03 MED ORDER — NAPROXEN 375 MG PO TABS: 375.0000 mg | ORAL_TABLET | Freq: Two times a day (BID) | ORAL | 0 refills | Status: DC

## 2017-02-03 NOTE — ED Provider Notes (Signed)
CSN: [id number]     Arrival date & time 02/03/17  1338 History   First MD Initiated Contact with Patient 02/03/17 1557     Chief Complaint  Patient presents with  . Hip Pain   (Consider location/radiation/quality/duration/timing/severity/associated sxs/prior Treatment) 41 year old obese female with a right hip pain for 2 days. She states it started gradually 2 days ago and has been getting worse. She noticed that while driving the pain was worse. It is located primarily to the anterior and lateral right thigh. Denies any known injury or trauma. No fall, MVC or other accidents. Pain is worse with attempted movement of the right lower extremity. 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      Past Medical History:  Diagnosis Date  . Anemia   . No pertinent past medical history   . Pelvic pain in female    Past Surgical History:  Procedure Laterality Date  . BILATERAL SALPINGECTOMY N/A 08/04/2015   Procedure: LEFT SALPINGECTOMY;  Surgeon: Linda Hedges, DO;  Location: [facility];  Service: Gynecology;  Laterality: N/A;  . CESAREAN SECTION    . ENDOMETRIAL ABLATION    . LAPAROSCOPIC ASSISTED VAGINAL HYSTERECTOMY N/A 08/04/2015   Procedure: LAPAROSCOPIC ASSISTED VAGINAL HYSTERECTOMY;  Surgeon: Linda Hedges, DO;  Location: [facility];  Service: Gynecology;  Laterality: N/A;  . LAPAROSCOPIC LYSIS OF ADHESIONS N/A 08/04/2015   Procedure: LAPAROSCOPIC LYSIS OF ADHESIONS;  Surgeon: Linda Hedges, DO;  Location: [facility];  Service: Gynecology;  Laterality: N/A;  . TUBAL LIGATION    . WISDOM TOOTH EXTRACTION     Family History  Problem Relation Age of Onset  . Other Neg Hx   . Heart disease Mother   . Heart disease Father   . Heart disease Brother   . Diabetes Brother    Social History  Substance Use Topics  . Smoking status: Former Smoker    Packs/day: 0.25    Types: Cigarettes    Quit date: 08/03/2015  .  Smokeless tobacco: Never Used  . Alcohol use No   OB History    Gravida Para Term Preterm AB Living   2 2 2     2    SAB TAB Ectopic Multiple Live Births                 Review of Systems  Constitutional: Positive for activity change. Negative for chills and fever.  HENT: Negative.   Respiratory: Negative.   Cardiovascular: Negative.   Musculoskeletal: Positive for myalgias. Negative for joint swelling, neck pain and neck stiffness.       As per HPI  Skin: Negative for color change, pallor and rash.  Neurological: Negative.   All other systems reviewed and are negative.   Allergies  Shellfish allergy and Morphine and related  Home Medications   Prior to Admission medications   Medication Sig Start Date End Date Taking? Authorizing Provider  cyclobenzaprine (FLEXERIL) 10 MG tablet Take 1 tablet (10 mg total) by mouth 3 (three) times daily as needed for muscle spasms. 02/03/17   Janne Napoleon, NP  naproxen (NAPROSYN) 375 MG tablet Take 1 tablet (375 mg total) by mouth 2 (two) times daily. 02/03/17   Janne Napoleon, NP  traMADol (ULTRAM) 50 MG tablet Take 1 tablet (50 mg total) by mouth every 6 (six) hours as needed. 02/03/17   Janne Napoleon, NP   Meds Ordered and Administered this Visit  Medications  ketorolac (TORADOL) injection 60 mg (not administered)    BP 145/86 (BP Location: Right Arm)   Pulse 82   Temp 98.6 F (37 C) (Oral)   Resp 20   LMP 07/25/2015 (Exact Date)   SpO2 100%  No data found.   Physical Exam  Constitutional: She is oriented to person, place, and time. She appears well-developed and well-nourished. No distress.  HENT:  Head: Normocephalic and atraumatic.  Eyes: EOM are normal.  Neck: Normal range of motion. Neck supple.  Cardiovascular: Normal rate.   Pulmonary/Chest: Effort normal.  Musculoskeletal: She exhibits no edema or deformity.  No direct bony tenderness to the right hip or thigh. Tenderness along the anterolateral thigh and quadricep muscle.  Primary muscles that are tender including the tensor fasciae latae, rectus femoris and vastus lateralis. Distal neurovascular motor sensory is intact. Decreased range of motion secondary to pain. Patient is able to flex and extend the knee. Attempts with abduction of the leg while straight increases pain in the muscles above. There is also some tenderness in the biceps femoris  Neurological: She is alert and oriented to person, place, and time. No cranial nerve deficit or sensory deficit. She exhibits normal muscle tone.  Skin: Skin is warm and dry. Capillary refill takes less than 2 seconds.  Psychiatric: She has a normal mood and affect.  Nursing note and vitals reviewed.   Urgent Care Course     Procedures (including critical care time)  Labs Review Labs Reviewed - No data to display  Imaging Review No results found.   Visual Acuity Review  Right Eye Distance:   Left Eye Distance:   Bilateral Distance:    Right Eye Near:   Left Eye Near:    Bilateral Near:         MDM   1. Muscle strain of right thigh, initial encounter    Apply heat to the affected area of muscle pain. Gradually start performing stretches of the involved muscles. Take the medications as directed to help with discomfort. These will not promote healing bubble help with inflammation and pain. Massage may be a good alternative. Meds ordered this encounter  Medications  . ketorolac (TORADOL) injection 60 mg  . naproxen (NAPROSYN) 375 MG tablet    Sig: Take 1 tablet (375 mg total) by mouth 2 (two) times daily.    Dispense:  20 tablet    Refill:  0    Order Specific Question:   Supervising Provider    Answer:   Sherlene Shams [[id number]]  . cyclobenzaprine (FLEXERIL) 10 MG tablet    Sig: Take 1 tablet (10 mg total) by mouth 3 (three) times daily as needed for muscle spasms.    Dispense:  20 tablet    Refill:  0    Order Specific Question:   Supervising Provider    Answer:   Sherlene Shams [[id number]]  .  traMADol (ULTRAM) 50 MG tablet    Sig: Take 1 tablet (50 mg total) by mouth every 6 (six) hours as needed.    Dispense:  15 tablet    Refill:  0    Order Specific Question:   Supervising Provider    Answer:   Sherlene Shams [[id number]]       Janne Napoleon, NP 02/03/17 [phone]

## 2017-02-03 NOTE — Discharge Instructions (Signed)
Apply heat to the affected area of muscle pain. Gradually start performing stretches of the involved muscles. Take the medications as directed to help with discomfort. These will not promote healing bubble help with inflammation and pain. Massage may be a good alternative.

## 2017-02-03 NOTE — ED Triage Notes (Signed)
The patient presented to the [facility] with a complaint of right hip pain that started 3 days ago. The patient denied any known injury.

## 2017-12-17 ENCOUNTER — Emergency Department (HOSPITAL_COMMUNITY)
Admission: EM | Admit: 2017-12-17 | Discharge: 2017-12-17 | Disposition: A | Discharge status: Home / Self Care | Payer: Self-pay | Attending: Emergency Medicine | Admitting: Emergency Medicine

## 2017-12-17 ENCOUNTER — Other Ambulatory Visit: Payer: Self-pay

## 2017-12-17 ENCOUNTER — Encounter (HOSPITAL_COMMUNITY): Payer: Self-pay | Admitting: *Deleted

## 2017-12-17 DIAGNOSIS — L243 Irritant contact dermatitis due to cosmetics: Secondary | ICD-10-CM | POA: Insufficient documentation

## 2017-12-17 MED ORDER — PREDNISONE 10 MG PO TABS: 20.0000 mg | ORAL_TABLET | Freq: Every day | ORAL | 0 refills | Status: AC

## 2017-12-17 MED ORDER — PREDNISONE 20 MG PO TABS
60.0000 mg | ORAL_TABLET | Freq: Once | ORAL | Status: AC
  Administered 2017-12-17: 60 mg via ORAL
  Filled 2017-12-17: qty 3

## 2017-12-17 NOTE — ED Provider Notes (Signed)
[facility] Provider Note   CSN: [id number] Arrival date & time: 12/17/17  1937     History   Chief Complaint Chief Complaint  Patient presents with  . Allergic Reaction    HPI Renee [name] is a 41 y.o. female pain to the ED with itching rash that began on Thursday.  Patient states she dyed her hair on Wednesday and woke up Thursday morning with itching/burning rash to her scalp and around her hairline.  States she has been taking Zyrtec and ibuprofen without relief.  Denies hives, swelling of lips or tongue, difficulty breathing, drainage from rash or other symptoms.  No other complaints.  The history is provided by the patient.    Past Medical History:  Diagnosis Date  . Anemia   . No pertinent past medical history   . Pelvic pain in female     Patient Active Problem List   Diagnosis Date Noted  . S/P hysterectomy 08/04/2015  . Pelvic pain in female 11/17/2012    Past Surgical History:  Procedure Laterality Date  . BILATERAL SALPINGECTOMY N/A 08/04/2015   Procedure: LEFT SALPINGECTOMY;  Surgeon: Linda Hedges, DO;  Location: [facility];  Service: Gynecology;  Laterality: N/A;  . CESAREAN SECTION    . ENDOMETRIAL ABLATION    . LAPAROSCOPIC ASSISTED VAGINAL HYSTERECTOMY N/A 08/04/2015   Procedure: LAPAROSCOPIC ASSISTED VAGINAL HYSTERECTOMY;  Surgeon: Linda Hedges, DO;  Location: [facility];  Service: Gynecology;  Laterality: N/A;  . LAPAROSCOPIC LYSIS OF ADHESIONS N/A 08/04/2015   Procedure: LAPAROSCOPIC LYSIS OF ADHESIONS;  Surgeon: Linda Hedges, DO;  Location: [facility];  Service: Gynecology;  Laterality: N/A;  . TUBAL LIGATION    . WISDOM TOOTH EXTRACTION      OB History    Gravida Para Term Preterm AB Living   2 2 2     2    SAB TAB Ectopic Multiple Live Births                   Home Medications    Prior to Admission medications   Medication Sig Start Date End Date Taking? Authorizing Provider  cyclobenzaprine (FLEXERIL)  10 MG tablet Take 1 tablet (10 mg total) by mouth 3 (three) times daily as needed for muscle spasms. 02/03/17   Janne Napoleon, NP  naproxen (NAPROSYN) 375 MG tablet Take 1 tablet (375 mg total) by mouth 2 (two) times daily. 02/03/17   Janne Napoleon, NP  predniSONE (DELTASONE) 10 MG tablet Take 2 tablets (20 mg total) by mouth daily for 5 days. 12/17/17 12/22/17  Robinson, [place] N, PA-C  traMADol (ULTRAM) 50 MG tablet Take 1 tablet (50 mg total) by mouth every 6 (six) hours as needed. 02/03/17   Janne Napoleon, NP    Family History Family History  Problem Relation Age of Onset  . Heart disease Mother   . Heart disease Father   . Heart disease Brother   . Diabetes Brother   . Other Neg Hx     Social History Social History   Tobacco Use  . Smoking status: Former Smoker    Packs/day: 0.25    Types: Cigarettes    Last attempt to quit: 08/03/2015    Years since quitting: 2.3  . Smokeless tobacco: Never Used  Substance Use Topics  . Alcohol use: No  . Drug use: No     Allergies   Shellfish allergy and Morphine and related   Review of Systems Review of Systems  Constitutional: Negative  for fever.  HENT: Negative for facial swelling.   Respiratory: Negative for shortness of breath, wheezing and stridor.   Skin: Positive for rash.  All other systems reviewed and are negative.    Physical Exam Updated Vital Signs BP (!) 177/88   Pulse 88   Temp 98.4 F (36.9 C) (Oral)   Resp 18   LMP 07/25/2015 (Exact Date)   SpO2 100%   Physical Exam  Constitutional: She appears well-developed and well-nourished. No distress.  HENT:  Head: Normocephalic and atraumatic.  Mouth/Throat: Oropharynx is clear and moist.  No oropharyngeal edema. Tolerating secretions.  Eyes: Conjunctivae are normal.  Cardiovascular: Normal rate and intact distal pulses.  Pulmonary/Chest: Effort normal.  Abdominal: Soft.  Neurological: She is alert.  Skin: Skin is warm.  Maculopapular rash along hairline  posteriorly and intermittently throughout scalp. No scaling, no central clearing, no vesicles.   Psychiatric: She has a normal mood and affect. Her behavior is normal.  Nursing note and vitals reviewed.    ED Treatments / Results  Labs (all labs ordered are listed, but only abnormal results are displayed) Labs Reviewed - No data to display  EKG  EKG Interpretation None       Radiology No results found.  Procedures Procedures (including critical care time)  Medications Ordered in ED Medications  predniSONE (DELTASONE) tablet 60 mg (60 mg Oral Given 12/17/17 2203)     Initial Impression / Assessment and Plan / ED Course  I have reviewed the triage vital signs and the nursing notes.  Pertinent labs & imaging results that were available during my care of the patient were reviewed by me and considered in my medical decision making (see chart for details).     Rash consistent with contact dermatits. Patient denies any difficulty breathing or swallowing.  Pt has a patent airway without stridor and is handling secretions without difficulty; no angioedema. No blisters, no pustules, no warmth, no draining sinus tracts, no superficial abscesses, no bullous impetigo, no vesicles, no desquamation, no target lesions with dusky purpura or a central bulla. Not tender to touch. No concern for superimposed infection. Will discharge home with short course of steroids, and recommend Benadryl as needed for pruritis. PCP follow up.  Discussed results, findings, treatment and follow up. Patient advised of return precautions. Patient verbalized understanding and agreed with plan.  Final Clinical Impressions(s) / ED Diagnoses   Final diagnoses:  Irritant contact dermatitis due to cosmetics    ED Discharge Orders        Ordered    predniSONE (DELTASONE) 10 MG tablet  Daily     12/17/17 2157       Robinson, [place] N, PA-C 12/17/17 2235    Duffy Bruce, MD 12/18/17 [phone]

## 2017-12-17 NOTE — ED Triage Notes (Signed)
Pt dyed her hair on Wednesday , has been having itching and burning to L face and ear since then. Has taken zyrtec and ibuprofen without improvement

## 2017-12-17 NOTE — Discharge Instructions (Addendum)
Please read instructions below. You can take Benadryl as needed every 6 hours for itching.  Avoid scratching as much as possible. Begin taking prednisone tomorrow as prescribed. You can also apply over-the-counter hydrocortisone cream for added relief. Follow up with your primary care provider if rash doesn't improve in the next few days. Return to the ER for fever, difficulty breathing or swallowing, or new or worsening symptoms.

## 2017-12-17 NOTE — ED Notes (Signed)
Instructed to follow up with primary care dr in regards to high BP

## 2018-02-20 ENCOUNTER — Emergency Department (HOSPITAL_COMMUNITY): Payer: Self-pay

## 2018-02-20 ENCOUNTER — Other Ambulatory Visit: Payer: Self-pay

## 2018-02-20 ENCOUNTER — Emergency Department (HOSPITAL_COMMUNITY)
Admission: EM | Admit: 2018-02-20 | Discharge: 2018-02-20 | Disposition: A | Discharge status: Home / Self Care | Payer: Self-pay | Attending: Emergency Medicine | Admitting: Emergency Medicine

## 2018-02-20 ENCOUNTER — Encounter (HOSPITAL_COMMUNITY): Payer: Self-pay | Admitting: Emergency Medicine

## 2018-02-20 DIAGNOSIS — I10 Essential (primary) hypertension: Secondary | ICD-10-CM | POA: Insufficient documentation

## 2018-02-20 DIAGNOSIS — Z87891 Personal history of nicotine dependence: Secondary | ICD-10-CM | POA: Insufficient documentation

## 2018-02-20 DIAGNOSIS — M5441 Lumbago with sciatica, right side: Secondary | ICD-10-CM | POA: Insufficient documentation

## 2018-02-20 DIAGNOSIS — M5417 Radiculopathy, lumbosacral region: Secondary | ICD-10-CM | POA: Insufficient documentation

## 2018-02-20 MED ORDER — KETOROLAC TROMETHAMINE 30 MG/ML IJ SOLN
30.0000 mg | Freq: Once | INTRAMUSCULAR | Status: AC
  Administered 2018-02-20: 30 mg via INTRAMUSCULAR
  Filled 2018-02-20: qty 1

## 2018-02-20 MED ORDER — TRAMADOL HCL 50 MG PO TABS: 50.0000 mg | ORAL_TABLET | Freq: Four times a day (QID) | ORAL | 0 refills | Status: DC | PRN

## 2018-02-20 MED ORDER — PREDNISONE 20 MG PO TABS: 40.0000 mg | ORAL_TABLET | Freq: Every day | ORAL | 0 refills | Status: DC

## 2018-02-20 MED ORDER — CYCLOBENZAPRINE HCL 10 MG PO TABS
10.0000 mg | ORAL_TABLET | Freq: Once | ORAL | Status: DC
  Filled 2018-02-20: qty 1

## 2018-02-20 NOTE — ED Triage Notes (Signed)
Pt c/o right hip pain that radiates through the right leg, c/o right foot numbness and left hand numbness. Denies urinary/bowel incontinence.

## 2018-02-20 NOTE — ED Notes (Signed)
Patient Alert and oriented to baseline. Stable and ambulatory to baseline. Patient verbalized understanding of the discharge instructions.  Patient belongings were taken by the patient.   

## 2018-02-20 NOTE — ED Notes (Signed)
Registration at bedside.

## 2018-02-20 NOTE — ED Provider Notes (Signed)
Patient placed in Quick Look pathway, seen and evaluated   Chief Complaint: right leg pain  HPI:   42 y.o. female here with c/o right hip pain that radiates to the right leg and foot. The pain started a week ago and is worse. 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 Patient denies loss of control of bladder or bowels. She denies chest pain or shortness of breath.  ROS: M/S: right hip and leg pain  Physical Exam:   Gen: No distress  Neuro: Awake and Alert  Skin: Warm and dry  M/S: tender with palpation over right  sciatic nerve.     Focused Exam:    Initiation of care has begun. The patient has been counseled on the process, plan, and necessity for staying for the completion/evaluation, and the remainder of the medical screening examination    Ashley Murrain, NP 02/20/18 1859    Carmin Muskrat, MD 02/20/18 [phone]

## 2018-02-20 NOTE — Discharge Instructions (Signed)
Please read attached information. If you experience any new or worsening signs or symptoms please return to the emergency room for evaluation. Please follow-up with your primary care provider or specialist as discussed. Please use medication prescribed only as directed and discontinue taking if you have any concerning signs or symptoms.   °

## 2018-02-20 NOTE — ED Provider Notes (Signed)
[facility] EMERGENCY DEPARTMENT Provider Note   CSN: [id number] Arrival date & time: 02/20/18  1819  History   Chief Complaint Chief Complaint  Patient presents with  . Leg Pain    HPI Renee [name] is a 42 y.o. female.  HPI   64 YOF presents today with complaints of lower back pain and leg pain.  Patient notes symptoms started approximately 1 week ago.  She denies any trauma.  She notes the pain radiates down from the right lower back has a pulling sensation in her hamstring and goes down into the lateral foot and toes.  She notes some numbness in the toes.  She denies any strength deficits.  She denies any other acute neurological deficits.  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  She notes this is in the third fourth and fifth digits; none presently.  Patient reports using over-the-counter medications at home without significant improvement in her symptoms.   Past Medical History:  Diagnosis Date  . Anemia   . No pertinent past medical history   . Pelvic pain in female     Patient Active Problem List   Diagnosis Date Noted  . S/P hysterectomy 08/04/2015  . Pelvic pain in female 11/17/2012    Past Surgical History:  Procedure Laterality Date  . BILATERAL SALPINGECTOMY N/A 08/04/2015   Procedure: LEFT SALPINGECTOMY;  Surgeon: Linda Harlean Regula, DO;  Location: [facility];  Service: Gynecology;  Laterality: N/A;  . CESAREAN SECTION    . ENDOMETRIAL ABLATION    . LAPAROSCOPIC ASSISTED VAGINAL HYSTERECTOMY N/A 08/04/2015   Procedure: LAPAROSCOPIC ASSISTED VAGINAL HYSTERECTOMY;  Surgeon: Linda Kemper Heupel, DO;  Location: [facility];  Service: Gynecology;  Laterality: N/A;  . LAPAROSCOPIC LYSIS OF ADHESIONS N/A 08/04/2015   Procedure: LAPAROSCOPIC LYSIS OF ADHESIONS;  Surgeon: Linda Katherleen Folkes, DO;  Location: [facility];  Service: Gynecology;  Laterality: N/A;  . TUBAL LIGATION    .  WISDOM TOOTH EXTRACTION      OB History    Gravida Para Term Preterm AB Living   2 2 2     2    SAB TAB Ectopic Multiple Live Births                   Home Medications    Prior to Admission medications   Medication Sig Start Date End Date Taking? Authorizing Provider  cyclobenzaprine (FLEXERIL) 10 MG tablet Take 1 tablet (10 mg total) by mouth 3 (three) times daily as needed for muscle spasms. 02/03/17   Janne Napoleon, NP  naproxen (NAPROSYN) 375 MG tablet Take 1 tablet (375 mg total) by mouth 2 (two) times daily. 02/03/17   Janne Napoleon, NP  predniSONE (DELTASONE) 20 MG tablet Take 2 tablets (40 mg total) by mouth daily. 02/20/18   Zygmunt Mcglinn, Dellis Filbert, PA-C  traMADol (ULTRAM) 50 MG tablet Take 1 tablet (50 mg total) by mouth every 6 (six) hours as needed. 02/20/18   Okey Regal, PA-C    Family History Family History  Problem Relation Age of Onset  . Heart disease Mother   . Heart disease Father   . Heart disease Brother   . Diabetes Brother   . Other Neg Hx     Social History Social History   Tobacco Use  . Smoking status: Former Smoker    Packs/day: 0.25    Types: Cigarettes    Last attempt to quit: 08/03/2015  Years since quitting: 2.5  . Smokeless tobacco: Never Used  Substance Use Topics  . Alcohol use: No  . Drug use: No     Allergies   Shellfish allergy and Morphine and related   Review of Systems Review of Systems  All other systems reviewed and are negative.  Physical Exam Updated Vital Signs BP (!) 165/89   Pulse 88   Temp 99 F (37.2 C) (Oral)   Resp 19   Ht 5' 3"  (1.6 m)   Wt 127 kg (280 lb)   LMP 07/25/2015 (Exact Date)   SpO2 99%   BMI 49.60 kg/m   Physical Exam  Constitutional: She is oriented to person, place, and time. She appears well-developed and well-nourished.  HENT:  Head: Normocephalic and atraumatic.  Eyes: Conjunctivae are normal. Pupils are equal, round, and reactive to light. Right eye exhibits no discharge. Left eye  exhibits no discharge. No scleral icterus.  Neck: Normal range of motion. No JVD present. No tracheal deviation present.  Pulmonary/Chest: Effort normal. No stridor.  Musculoskeletal:  No CT or L-spine tenderness palpation, minor tenderness palpation of the right lateral lumbar musculature and gluteus-bilateral lower extremity strength and motor function intact decreased sensation along the lateral foot right  Neurological: She is alert and oriented to person, place, and time. Coordination normal.  Psychiatric: She has a normal mood and affect. Her behavior is normal. Judgment and thought content normal.  Nursing note and vitals reviewed.    ED Treatments / Results  Labs (all labs ordered are listed, but only abnormal results are displayed) Labs Reviewed - No data to display  EKG  EKG Interpretation None       Radiology Dg Lumbar Spine Complete  Result Date: 02/20/2018 CLINICAL DATA:  Right hip pain radiating down right leg beginning 1 week ago in getting worse. Chronic low back pain. Right foot numbness. EXAM: LUMBAR SPINE - COMPLETE 4+ VIEW COMPARISON:  None. FINDINGS: Vertebral body alignment and heights are within normal. There is minimal spondylosis of the lumbar spine. Disc space heights are maintained. No evidence of compression fracture or spondylolisthesis. No spondylolysis. IMPRESSION: No acute findings. Minimal spondylosis of the lumbar spine. Electronically Signed   By: Marin Olp M.D.   On: 02/20/2018 21:01    Procedures Procedures (including critical care time)  Medications Ordered in ED Medications  ketorolac (TORADOL) 30 MG/ML injection 30 mg (30 mg Intramuscular Given 02/20/18 2031)     Initial Impression / Assessment and Plan / ED Course  I have reviewed the triage vital signs and the nursing notes.  Pertinent labs & imaging results that were available during my care of the patient were reviewed by me and considered in my medical decision making (see chart  for details).     Final Clinical Impressions(s) / ED Diagnoses   Final diagnoses:  Acute right-sided low back pain with right-sided sciatica  Lumbosacral radiculopathy  Hypertension, unspecified type    Labs:   Imaging: DG lumbar 2 view  Consults:  Therapeutics: Toradol  Discharge Meds: Prednisone, Ultram  Assessment/Plan: 42 year old female presents today with low back pain and sciatic symptoms.  She has no significant strength deficits.  Plain films without acute findings.  Patient encouraged to follow-up with primary care for repeat evaluation.  Patient given prescription for tramadol and prednisone.  Discussed risks and benefits of medication, she would like to proceed.  Patient also hypertensive here, likely secondary to pain with no history of hypertension in the past.  Patient  will continue monitoring blood pressure at home follow-up with primary care if consistently elevated.  She is given strict return precautions.    ED Discharge Orders        Ordered    predniSONE (DELTASONE) 20 MG tablet  Daily     02/20/18 2109    traMADol (ULTRAM) 50 MG tablet  Every 6 hours PRN     02/20/18 2109       Francee Gentile 02/20/18 2141    Carmin Muskrat, MD 02/20/18 [phone]

## 2018-12-09 ENCOUNTER — Encounter (HOSPITAL_COMMUNITY): Payer: Self-pay

## 2018-12-09 ENCOUNTER — Emergency Department (HOSPITAL_COMMUNITY)
Admission: EM | Admit: 2018-12-09 | Discharge: 2018-12-09 | Disposition: A | Discharge status: Home / Self Care | Payer: Self-pay | Attending: Emergency Medicine | Admitting: Emergency Medicine

## 2018-12-09 ENCOUNTER — Other Ambulatory Visit: Payer: Self-pay

## 2018-12-09 ENCOUNTER — Emergency Department (HOSPITAL_COMMUNITY): Payer: Worker's Compensation

## 2018-12-09 DIAGNOSIS — Z79899 Other long term (current) drug therapy: Secondary | ICD-10-CM | POA: Insufficient documentation

## 2018-12-09 DIAGNOSIS — S6991XA Unspecified injury of right wrist, hand and finger(s), initial encounter: Secondary | ICD-10-CM | POA: Insufficient documentation

## 2018-12-09 DIAGNOSIS — X500XXA Overexertion from strenuous movement or load, initial encounter: Secondary | ICD-10-CM | POA: Insufficient documentation

## 2018-12-09 DIAGNOSIS — Y9289 Other specified places as the place of occurrence of the external cause: Secondary | ICD-10-CM | POA: Insufficient documentation

## 2018-12-09 DIAGNOSIS — Y9389 Activity, other specified: Secondary | ICD-10-CM | POA: Insufficient documentation

## 2018-12-09 DIAGNOSIS — Z87891 Personal history of nicotine dependence: Secondary | ICD-10-CM | POA: Insufficient documentation

## 2018-12-09 DIAGNOSIS — Y990 Civilian activity done for income or pay: Secondary | ICD-10-CM | POA: Insufficient documentation

## 2018-12-09 MED ORDER — NAPROXEN 500 MG PO TABS: 500.0000 mg | ORAL_TABLET | Freq: Two times a day (BID) | ORAL | 0 refills | Status: AC

## 2018-12-09 NOTE — ED Triage Notes (Signed)
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 Pt states now her hand feels numb.

## 2018-12-09 NOTE — ED Provider Notes (Signed)
[facility] EMERGENCY DEPARTMENT Provider Note   CSN: [id number] Arrival date & time: 12/09/18  1253     History   Chief Complaint Chief Complaint  Patient presents with  . Hand Injury    HPI Renee [name] is a 42 y.o. female with a hx of former tobacco use, s/p tubal ligation who presents to the ED with RUE injury which occurred last night. 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 She has had pain to the wrist with radiating to the hand & forearm since then. States pain is constant, worse with movement, no alleviating factors. Believes there is some swelling about the wrist. Intermittent paresthesias to digits 2-5, not constant, not completely numb. Denies weakness, redness, fever, or chills. No other areas of injury. No prior surgeries to RUE. Patient is R hand dominant.   HPI  Past Medical History:  Diagnosis Date  . Anemia   . No pertinent past medical history   . Pelvic pain in female     Patient Active Problem List   Diagnosis Date Noted  . S/P hysterectomy 08/04/2015  . Pelvic pain in female 11/17/2012    Past Surgical History:  Procedure Laterality Date  . BILATERAL SALPINGECTOMY N/A 08/04/2015   Procedure: LEFT SALPINGECTOMY;  Surgeon: Linda Hedges, DO;  Location: [facility];  Service: Gynecology;  Laterality: N/A;  . CESAREAN SECTION    . ENDOMETRIAL ABLATION    . LAPAROSCOPIC ASSISTED VAGINAL HYSTERECTOMY N/A 08/04/2015   Procedure: LAPAROSCOPIC ASSISTED VAGINAL HYSTERECTOMY;  Surgeon: Linda Hedges, DO;  Location: [facility];  Service: Gynecology;  Laterality: N/A;  . LAPAROSCOPIC LYSIS OF ADHESIONS N/A 08/04/2015   Procedure: LAPAROSCOPIC LYSIS OF ADHESIONS;  Surgeon: Linda Hedges, DO;  Location: [facility];  Service: Gynecology;  Laterality: N/A;  . TUBAL LIGATION    . WISDOM TOOTH EXTRACTION       OB History    Gravida  2   Para  2   Term  2   Preterm      AB      Living  2     SAB      TAB      Ectopic      Multiple      Live Births               Home Medications    Prior to Admission medications   Medication Sig Start Date End Date Taking? Authorizing Provider  cyclobenzaprine (FLEXERIL) 10 MG tablet Take 1 tablet (10 mg total) by mouth 3 (three) times daily as needed for muscle spasms. 02/03/17   Janne Napoleon, NP  naproxen (NAPROSYN) 375 MG tablet Take 1 tablet (375 mg total) by mouth 2 (two) times daily. 02/03/17   Janne Napoleon, NP  predniSONE (DELTASONE) 20 MG tablet Take 2 tablets (40 mg total) by mouth daily. 02/20/18   Hedges, Dellis Filbert, PA-C  traMADol (ULTRAM) 50 MG tablet Take 1 tablet (50 mg total) by mouth every 6 (six) hours as needed. 02/20/18   Okey Regal, PA-C    Family History Family History  Problem Relation Age of Onset  . Heart disease Mother   . Heart disease Father   . Heart disease Brother   . Diabetes Brother   . Other Neg Hx     Social History Social History   Tobacco Use  . Smoking status: Former Smoker  Packs/day: 0.25    Types: Cigarettes    Last attempt to quit: 08/03/2015    Years since quitting: 3.3  . Smokeless tobacco: Never Used  Substance Use Topics  . Alcohol use: No  . Drug use: No     Allergies   Shellfish allergy and Morphine and related   Review of Systems Review of Systems  Constitutional: Negative for chills and fever.  Musculoskeletal: Positive for arthralgias, joint swelling and myalgias.  Skin: Negative for color change, rash and wound.  Neurological: Negative for weakness and numbness.       Positive for intermittent paresthesias to RUE digits 1-4.      Physical Exam Updated Vital Signs BP (!) 152/81 (BP Location: Left Arm)   Pulse 85   Temp 98.6 F (37 C) (Oral)   Resp 18   Ht 5' 3"  (1.6 m)   Wt 122.5 kg   LMP 07/25/2015 (Exact Date)   SpO2 98%   BMI 47.83 kg/m   Physical Exam Vitals signs and nursing note  reviewed.  Constitutional:      General: She is not in acute distress.    Appearance: She is well-developed.  HENT:     Head: Normocephalic and atraumatic.  Eyes:     General:        Right eye: No discharge.        Left eye: No discharge.     Conjunctiva/sclera: Conjunctivae normal.  Cardiovascular:     Pulses:          Radial pulses are 2+ on the right side and 2+ on the left side.  Musculoskeletal:     Comments: No obvious deformity, appreciable swelling, erythema, ecchymosis, or open wounds.  Upper extremities: Patient has full intact AROM to the shoulders and all digits as well as the left wrist. R wrist flexion/extension mildly limited secondary to pain. Tender to palpation from the elbow distally diffusely without point/focal bony tenderness. No snuffbox tenderness. NVI distally.   Skin:    Capillary Refill: Capillary refill takes less than 2 seconds.  Neurological:     Mental Status: She is alert.     Comments: Clear speech. Sensation grossly intact to bilateral upper extremities. Good grip strength bilaterally. Able to perform OK sign, thumbs up, and cross 2nd/3rd digits bilaterally.   Psychiatric:        Behavior: Behavior normal.        Thought Content: Thought content normal.      ED Treatments / Results  Labs (all labs ordered are listed, but only abnormal results are displayed) Labs Reviewed - No data to display  EKG None  Radiology Dg Elbow Complete Right  Result Date: 12/09/2018 CLINICAL DATA:  Right elbow injury yesterday with pain, initial encounter EXAM: RIGHT ELBOW - COMPLETE 3+ VIEW COMPARISON:  None. FINDINGS: There is no evidence of fracture, dislocation, or joint effusion. There is no evidence of arthropathy or other focal bone abnormality. Soft tissues are unremarkable. IMPRESSION: No acute abnormality noted. Electronically Signed   By: Inez Catalina M.D.   On: 12/09/2018 14:35   Dg Wrist Complete Right  Result Date: 12/09/2018 CLINICAL DATA:   Right wrist pain following injury yesterday, initial encounter EXAM: RIGHT WRIST - COMPLETE 3+ VIEW COMPARISON:  None. FINDINGS: There is no evidence of fracture or dislocation. There is no evidence of arthropathy or other focal bone abnormality. Soft tissues are unremarkable. IMPRESSION: No acute abnormality noted. Electronically Signed   By: Linus Mako.D.  On: 12/09/2018 14:33   Dg Hand Complete Right  Result Date: 12/09/2018 CLINICAL DATA:  Injury yesterday with hand pain and numbness, initial encounter EXAM: RIGHT HAND - COMPLETE 3+ VIEW COMPARISON:  None. FINDINGS: No acute fracture or dislocation is noted. No gross soft tissue abnormality is seen. Tiny well corticated bony densities are noted adjacent to the distal interphalangeal joints. These may be related to prior trauma. IMPRESSION: No acute abnormality noted. Electronically Signed   By: Inez Catalina M.D.   On: 12/09/2018 14:29    Procedures Procedures (including critical care time) SPLINT APPLICATION Date/Time: 0:32 PM Authorized by: Kennith Maes Consent: Verbal consent obtained. Risks and benefits: risks, benefits and alternatives were discussed Consent given by: patient Splint applied by: RN Location details: RUE Splint type: velcro wrist brace.  Post-procedure: The splinted body part was neurovascularly unchanged following the procedure. Patient tolerance: Patient tolerated the procedure well with no immediate complications.  Medications Ordered in ED Medications - No data to display   Initial Impression / Assessment and Plan / ED Course  I have reviewed the triage vital signs and the nursing notes.  Pertinent labs & imaging results that were available during my care of the patient were reviewed by me and considered in my medical decision making (see chart for details).    Patient presents to the ED with complaints of pain to the  R wrist pain s/p injury last night. Exam without obvious deformity or open  wounds. ROM intact w/ mild limitation at the wrist. Tender to palpation diffusely from elbow to digits. NVI distally. Xrays negative for fracture/dislocation. Therapeutic splint provided. PRICE and Naproxen. I discussed results, treatment plan, need for follow-up, and return precautions with the patient. Provided opportunity for questions, patient confirmed understanding and are in agreement with plan.    Final Clinical Impressions(s) / ED Diagnoses   Final diagnoses:  Injury of right wrist, initial encounter    ED Discharge Orders         Ordered    naproxen (NAPROSYN) 500 MG tablet  2 times daily     12/09/18 1440           Amaryllis Dyke, PA-C 12/09/18 1501    Carmin Muskrat, MD 12/11/18 2355

## 2018-12-09 NOTE — Discharge Instructions (Signed)
Please read and follow all provided instructions.  You have been seen today for a right wrist/hand injury.   Tests performed today include: An x-ray of the affected areas - does NOT show any broken bones or dislocations.  Vital signs. See below for your results today.   Home care instructions: -- *PRICE in the first 24-48 hours after injury: Protect (with brace, splint, sling), if given by your provider Rest Ice- Do not apply ice pack directly to your skin, place towel or similar between your skin and ice/ice pack. Apply ice for 20 min, then remove for 40 min while awake Compression- Wear brace, elastic bandage, splint as directed by your provider Elevate affected extremity above the level of your heart when not walking around for the first 24-48 hours   Medications: - Naproxen is a nonsteroidal anti-inflammatory medication that will help with pain and swelling. Be sure to take this medication as prescribed with food, 1 pill every 12 hours,  It should be taken with food, as it can cause stomach upset, and more seriously, stomach bleeding. Do not take other nonsteroidal anti-inflammatory medications with this such as Advil, Motrin, Aleve, Mobic, Goodie Powder, or Motrin.    You make take Tylenol per over the counter dosing with these medications.   We have prescribed you new medication(s) today. Discuss the medications prescribed today with your pharmacist as they can have adverse effects and interactions with your other medicines including over the counter and prescribed medications. Seek medical evaluation if you start to experience new or abnormal symptoms after taking one of these medicines, seek care immediately if you start to experience difficulty breathing, feeling of your throat closing, facial swelling, or rash as these could be indications of a more serious allergic reaction   Follow-up instructions: Please follow-up with your primary care provider or the provided orthopedic  physician (bone specialist) if you continue to have significant pain in 1 week. In this case you may have a more severe injury that requires further care.   Return instructions:  Please return if your digits or extremity are numb or tingling, appear gray or blue, or you have severe pain (also elevate the extremity and loosen splint or wrap if you were given one) Please return if you have redness or fevers.  Please return to the Emergency Department if you experience worsening symptoms.  Please return if you have any other emergent concerns. Additional Information:  Your vital signs today were: BP (!) 152/81 (BP Location: Left Arm)    Pulse 85    Temp 98.6 F (37 C) (Oral)    Resp 18    Ht 5' 3"  (1.6 m)    Wt 122.5 kg    LMP 07/25/2015 (Exact Date)    SpO2 98%    BMI 47.83 kg/m  If your blood pressure (BP) was elevated above 135/85 this visit, please have this repeated by your doctor within one month. ---------------

## 2019-12-10 ENCOUNTER — Other Ambulatory Visit: Payer: Self-pay

## 2019-12-10 DIAGNOSIS — Z20822 Contact with and (suspected) exposure to covid-19: Secondary | ICD-10-CM

## 2019-12-11 LAB — NOVEL CORONAVIRUS, NAA: SARS-CoV-2, NAA: DETECTED — AB

## 2019-12-12 ENCOUNTER — Telehealth (HOSPITAL_COMMUNITY): Payer: Self-pay | Admitting: Critical Care Medicine

## 2019-12-12 NOTE — Telephone Encounter (Signed)
I connected with this patient is Covid positive and she is completely asymptomatic.  She does have a BMI of 48 and she is asymptomatic therefore she is not a candidate for monoclonal antibody she understands the quarantine isolation.  To go for 10 days from testing date which would take her out of isolation December 28

## 2020-01-19 DIAGNOSIS — G4730 Sleep apnea, unspecified: Secondary | ICD-10-CM | POA: Insufficient documentation

## 2020-04-15 IMAGING — CR DG HAND COMPLETE 3+V*R*
3 series · 3 of 3 positions shown · non-contrast
Comparison: None.

CLINICAL DATA: Injury yesterday with hand pain and numbness,
initial encounter

EXAM:
RIGHT HAND - COMPLETE 3+ VIEW

[hand pa]
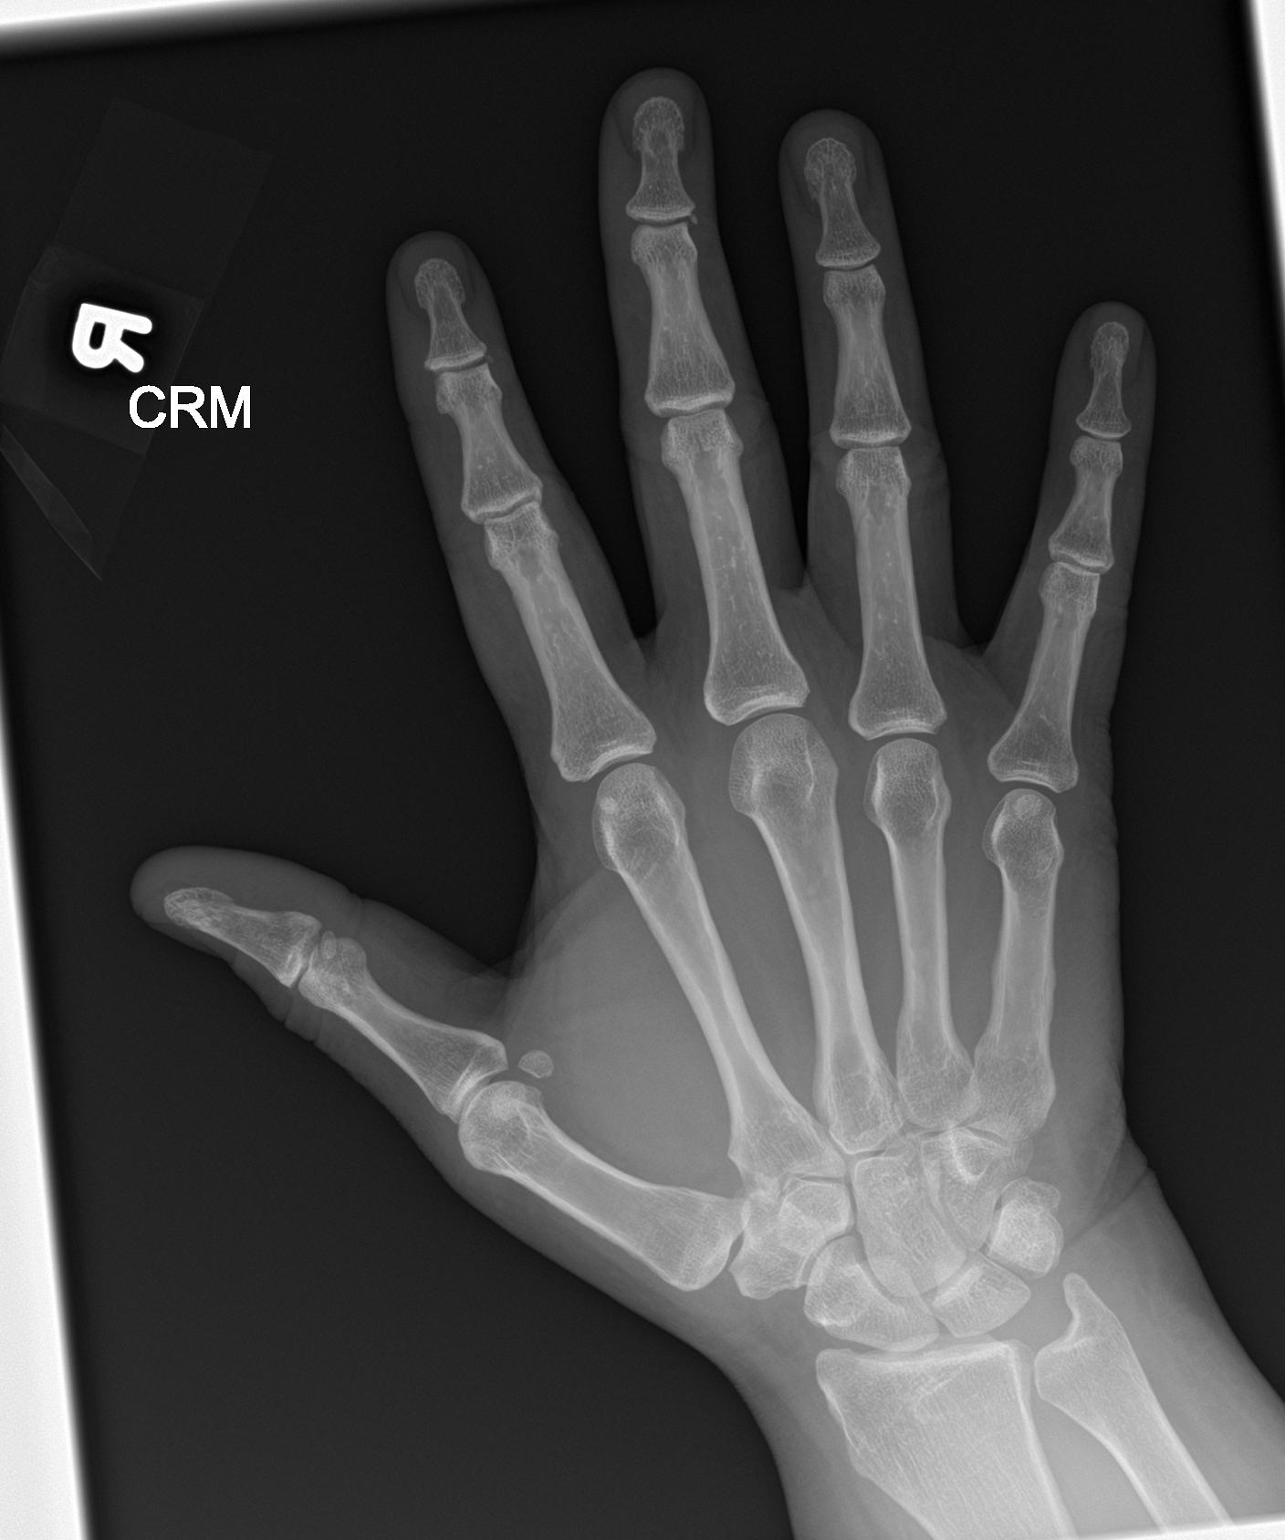

[hand obl]
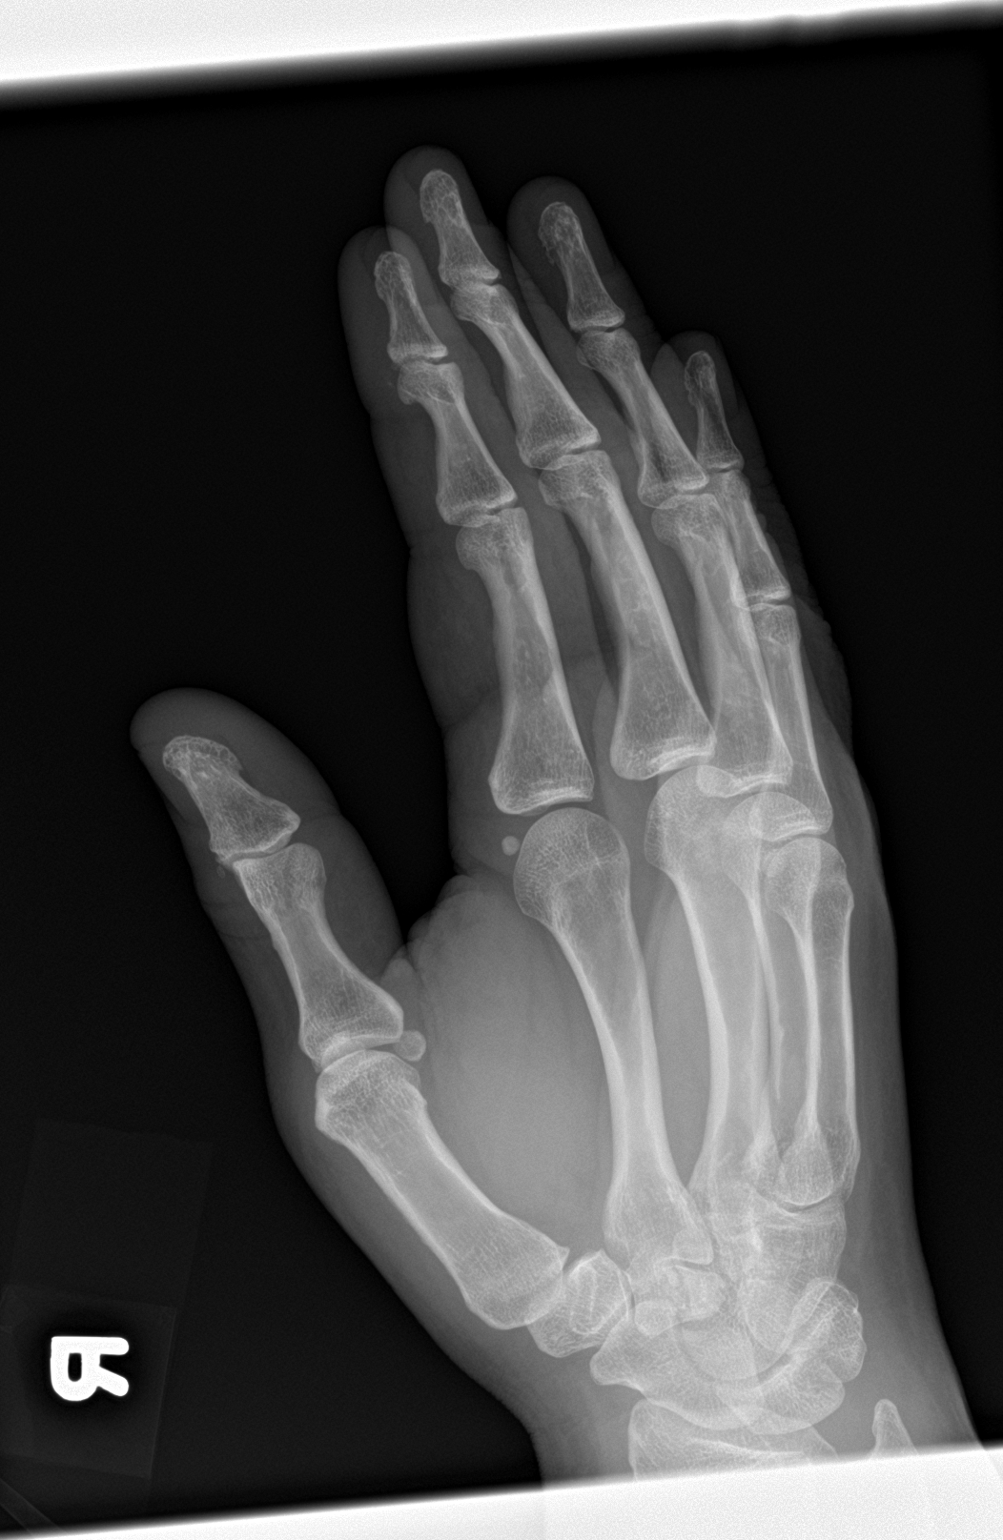

[hand lat]
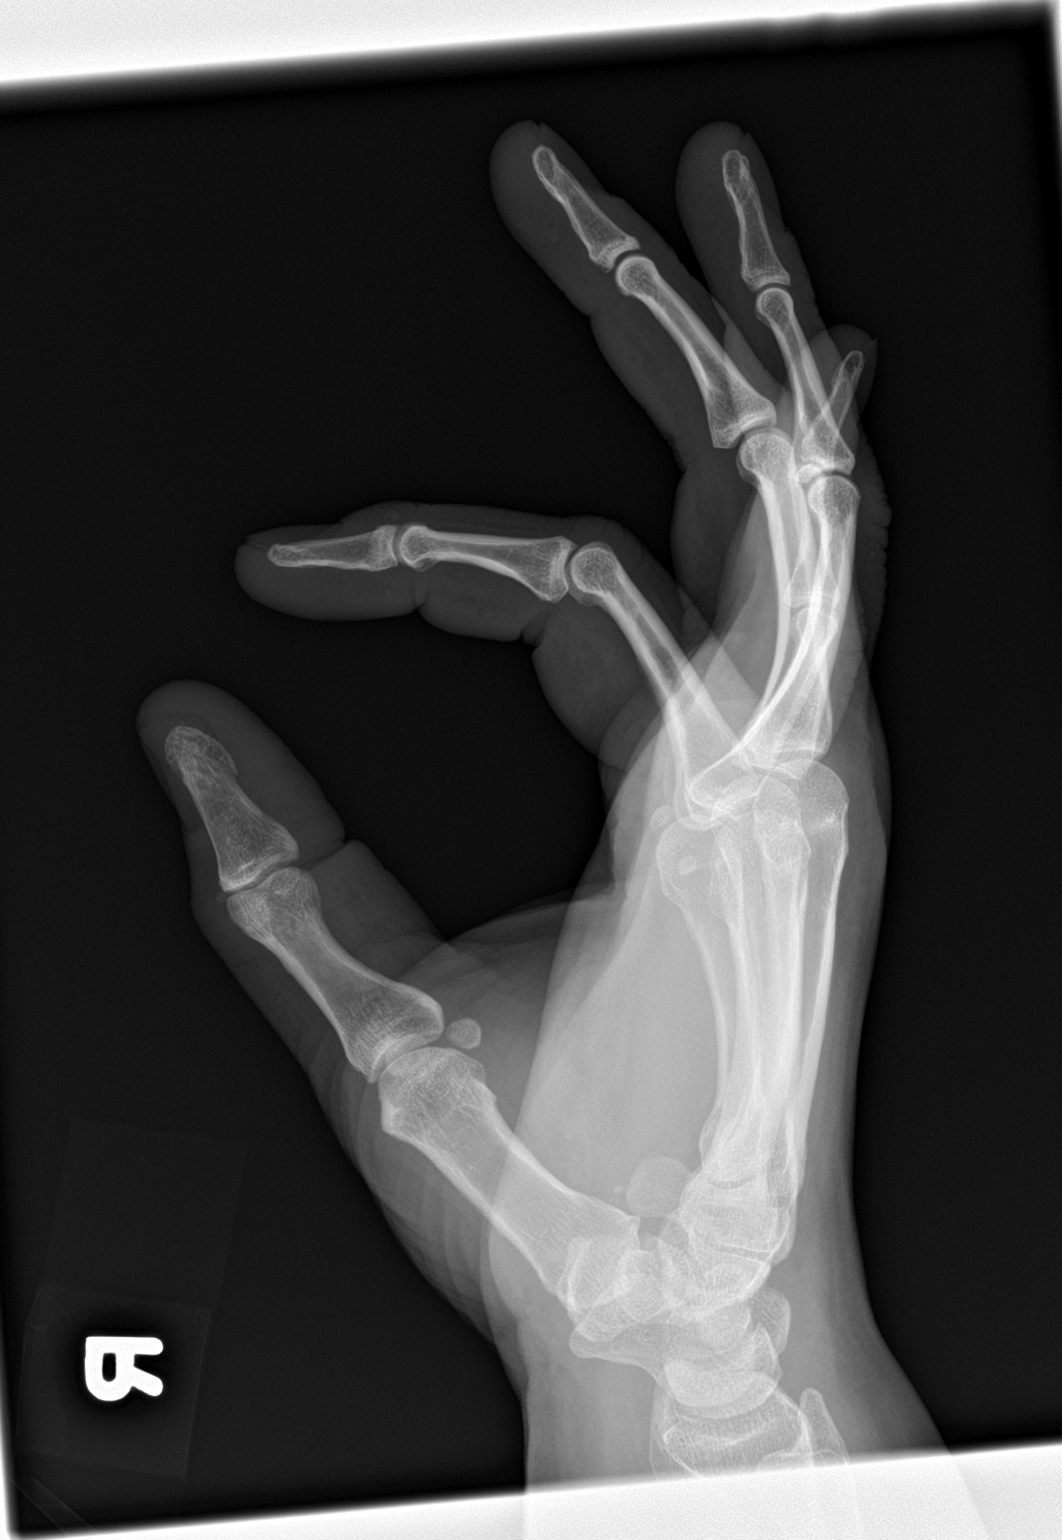

[3 of 3 positions shown; findings below may reference images not displayed]

FINDINGS: No acute fracture or dislocation is noted. No gross soft tissue
abnormality is seen. Tiny well corticated bony densities are noted
adjacent to the distal interphalangeal joints. These may be related
to prior trauma.
IMPRESSION: No acute abnormality noted.

## 2022-03-15 LAB — COLOGUARD: COLOGUARD: NEGATIVE

## 2022-03-15 LAB — EXTERNAL GENERIC LAB PROCEDURE: COLOGUARD: NEGATIVE

## 2023-01-16 ENCOUNTER — Emergency Department (HOSPITAL_BASED_OUTPATIENT_CLINIC_OR_DEPARTMENT_OTHER)
Admission: EM | Admit: 2023-01-16 | Discharge: 2023-01-16 | Disposition: A | Discharge status: Home / Self Care | Payer: BLUE CROSS/BLUE SHIELD | Attending: Emergency Medicine | Admitting: Emergency Medicine

## 2023-01-16 ENCOUNTER — Emergency Department (HOSPITAL_BASED_OUTPATIENT_CLINIC_OR_DEPARTMENT_OTHER): Payer: BLUE CROSS/BLUE SHIELD

## 2023-01-16 ENCOUNTER — Encounter (HOSPITAL_BASED_OUTPATIENT_CLINIC_OR_DEPARTMENT_OTHER): Payer: Self-pay

## 2023-01-16 ENCOUNTER — Other Ambulatory Visit: Payer: Self-pay

## 2023-01-16 DIAGNOSIS — M542 Cervicalgia: Secondary | ICD-10-CM | POA: Insufficient documentation

## 2023-01-16 DIAGNOSIS — S0990XA Unspecified injury of head, initial encounter: Secondary | ICD-10-CM | POA: Insufficient documentation

## 2023-01-16 DIAGNOSIS — W07XXXA Fall from chair, initial encounter: Secondary | ICD-10-CM | POA: Insufficient documentation

## 2023-01-16 DIAGNOSIS — R519 Headache, unspecified: Secondary | ICD-10-CM | POA: Diagnosis present

## 2023-01-16 MED ORDER — KETOROLAC TROMETHAMINE 15 MG/ML IJ SOLN
15.0000 mg | Freq: Once | INTRAMUSCULAR | Status: AC
  Administered 2023-01-16: 15 mg via INTRAMUSCULAR
  Filled 2023-01-16: qty 1

## 2023-01-16 MED ORDER — ACETAMINOPHEN 500 MG PO TABS
1000.0000 mg | ORAL_TABLET | Freq: Once | ORAL | Status: AC
  Administered 2023-01-16: 1000 mg via ORAL
  Filled 2023-01-16: qty 2

## 2023-01-16 NOTE — ED Triage Notes (Signed)
Patient here POV from Home.  Endorses Mechanical Fall today at approximately 1700 today. Was sitting in Stool when the stool broke causing her fall.  Head Impact against Window Sill. Unsure of LOC but Possible and Most Likely. No Anticoagulants.   NAD Noted during Triage. A&Ox4. GCS 15. Ambulatory.

## 2023-01-16 NOTE — ED Provider Notes (Signed)
[facility] Provider Note   CSN: [id number] Arrival date & time: 01/16/23  1738     History  Chief Complaint  Patient presents with   Head Injury    Renee [name] is a 47 y.o. female with morbid obesity, OSA presents with head injury.  Patient presents after a fall when the chair broke.  She fell backward and hit the back of her head on the ground.  She lost consciousness for "a few minutes."  She did not have any nausea or vomiting when she woke up.  Does endorse some right lateral neck pain.  Denies any numbness tingling, visual changes, asymmetric weakness, chest abdominal pain.  She does not take any blood thinners.   Head Injury      Home Medications Prior to Admission medications   Medication Sig Start Date End Date Taking? Authorizing Provider  cyclobenzaprine (FLEXERIL) 10 MG tablet Take 1 tablet (10 mg total) by mouth 3 (three) times daily as needed for muscle spasms. 02/03/17   Janne Napoleon, NP  naproxen (NAPROSYN) 500 MG tablet Take 1 tablet (500 mg total) by mouth 2 (two) times daily. 12/09/18   Petrucelli, Samantha R, PA-C  predniSONE (DELTASONE) 20 MG tablet Take 2 tablets (40 mg total) by mouth daily. 02/20/18   Hedges, Dellis Filbert, PA-C  traMADol (ULTRAM) 50 MG tablet Take 1 tablet (50 mg total) by mouth every 6 (six) hours as needed. 02/20/18   Hedges, Dellis Filbert, PA-C      Allergies    Shellfish allergy and Morphine and related    Review of Systems   Review of Systems Review of systems Positive for LOC.  A 10 point review of systems was performed and is negative unless otherwise reported in HPI.  Physical Exam Updated Vital Signs BP (!) 182/97   Pulse 80   Temp 97.7 F (36.5 C)   Resp 16   Ht 5\' 3"  (1.6 m)   Wt 122.5 kg   LMP 07/25/2015 (Exact Date)   SpO2 100%   BMI 47.84 kg/m  Physical Exam General: Normal appearing female, lying in bed.  HEENT: PERRLA, EOMI, Sclera anicteric, MMM, trachea midline. NCAT.  No wounds/abrasions, no skull deformities or depressions.  Patient has tenderness palpation in the crown of her head without any abnormalities noted.  Right lateral neck tenderness to palpation without any C-spine midline tenderness deformities or step-offs.   Back: Left lumbar paraspinal muscular tenderness without any midline tenderness deformities or step-offs. Cardiology: RRR, no murmurs/rubs/gallops. BL radial and DP pulses equal bilaterally.  Resp: Normal respiratory rate and effort. CTAB, no wheezes, rhonchi, crackles.  Abd: Soft, non-tender, non-distended. No rebound tenderness or guarding.  GU: Deferred. MSK: No peripheral edema or signs of trauma. Extremities without deformity or TTP. No cyanosis or clubbing. Skin: warm, dry. No rashes or lesions. Neuro: A&Ox4, CNs II-XII grossly intact. MAEs. Sensation grossly intact.  Psych: Normal mood and affect.   ED Results / Procedures / Treatments   Labs (all labs ordered are listed, but only abnormal results are displayed) Labs Reviewed - No data to display  EKG None  Radiology CT Head Wo Contrast  Result Date: 01/16/2023 CLINICAL DATA:  Fall EXAM: CT HEAD WITHOUT CONTRAST CT CERVICAL SPINE WITHOUT CONTRAST TECHNIQUE: Multidetector CT imaging of the head and cervical spine was performed following the standard protocol without intravenous contrast. Multiplanar CT image reconstructions of the cervical spine were also generated. RADIATION DOSE REDUCTION: This exam was performed according to the  departmental dose-optimization program which includes automated exposure control, adjustment of the mA and/or kV according to patient size and/or use of iterative reconstruction technique. COMPARISON:  None Available. FINDINGS: CT HEAD FINDINGS Brain: There is no acute intracranial hemorrhage, extra-axial fluid collection, or acute infarct. Parenchymal volume is normal. The ventricles are normal in size. Gray-white differentiation is preserved There is no  mass lesion. There is no mass effect or midline shift. An empty sella is noted, nonspecific. Vascular: No hyperdense vessel or unexpected calcification. Skull: Normal. Negative for fracture or focal lesion. Sinuses/Orbits: The paranasal sinuses are clear. The globes and orbits are unremarkable. Other: None. CT CERVICAL SPINE FINDINGS Alignment: There is no antero or retrolisthesis. There is no jumped or perched facet or other evidence of traumatic malalignment. Skull base and vertebrae: Skull base alignment is maintained. Vertebral body heights are preserved. There is no evidence of acute fracture. There is no suspicious osseous lesion. Soft tissues and spinal canal: No prevertebral fluid or swelling. No visible canal hematoma. Disc levels: There is mild degenerative endplate change at C4-C5 and C5-C6 with probable small disc protrusions. There is no evidence of high-grade spinal canal or neural foraminal stenosis. Upper chest: The imaged lung apices are clear. Other: None. IMPRESSION: 1. No acute intracranial pathology. 2. No acute fracture or traumatic malalignment of the cervical spine. Electronically Signed   By: Lesia Hausen M.D.   On: 01/16/2023 19:08   CT Cervical Spine Wo Contrast  Result Date: 01/16/2023 CLINICAL DATA:  Fall EXAM: CT HEAD WITHOUT CONTRAST CT CERVICAL SPINE WITHOUT CONTRAST TECHNIQUE: Multidetector CT imaging of the head and cervical spine was performed following the standard protocol without intravenous contrast. Multiplanar CT image reconstructions of the cervical spine were also generated. RADIATION DOSE REDUCTION: This exam was performed according to the departmental dose-optimization program which includes automated exposure control, adjustment of the mA and/or kV according to patient size and/or use of iterative reconstruction technique. COMPARISON:  None Available. FINDINGS: CT HEAD FINDINGS Brain: There is no acute intracranial hemorrhage, extra-axial fluid collection, or acute  infarct. Parenchymal volume is normal. The ventricles are normal in size. Gray-white differentiation is preserved There is no mass lesion. There is no mass effect or midline shift. An empty sella is noted, nonspecific. Vascular: No hyperdense vessel or unexpected calcification. Skull: Normal. Negative for fracture or focal lesion. Sinuses/Orbits: The paranasal sinuses are clear. The globes and orbits are unremarkable. Other: None. CT CERVICAL SPINE FINDINGS Alignment: There is no antero or retrolisthesis. There is no jumped or perched facet or other evidence of traumatic malalignment. Skull base and vertebrae: Skull base alignment is maintained. Vertebral body heights are preserved. There is no evidence of acute fracture. There is no suspicious osseous lesion. Soft tissues and spinal canal: No prevertebral fluid or swelling. No visible canal hematoma. Disc levels: There is mild degenerative endplate change at C4-C5 and C5-C6 with probable small disc protrusions. There is no evidence of high-grade spinal canal or neural foraminal stenosis. Upper chest: The imaged lung apices are clear. Other: None. IMPRESSION: 1. No acute intracranial pathology. 2. No acute fracture or traumatic malalignment of the cervical spine. Electronically Signed   By: Lesia Hausen M.D.   On: 01/16/2023 19:08    Procedures Procedures    Medications Ordered in ED Medications  acetaminophen (TYLENOL) tablet 1,000 mg (1,000 mg Oral Given 01/16/23 2217)  ketorolac (TORADOL) 15 MG/ML injection 15 mg (15 mg Intramuscular Given 01/16/23 2217)    ED Course/ Medical Decision Making/ A&P  Medical Decision Making Amount and/or Complexity of Data Reviewed Radiology: ordered.  Risk OTC drugs. Prescription drug management.    This patient presents to the ED for concern of head trauma w/ LOC, this involves an extensive number of treatment options, and is a complaint that carries with it a high risk of  complications and morbidity.  I considered the following differential and admission for this acute, potentially life threatening condition.   MDM:    DDX for trauma includes but is not limited to:  -Head Injury such as skull fx or ICH -with fall and head injury with LOC there is concern for ICH and will obtain CT head -Chest Injury and Abdominal Injury -patient has no chest or abdominal pain and no signs of trauma on exam-Spinal Cord or Vertebral injury -patient does have right lateral neck pain with no midline C-spine tenderness palpation however CT C-spine was ordered from triage.  She has no focal neurodeficits to raise concern for spinal cord injury. -Fractures -no extremity pain or deformities      Imaging Studies ordered: Imaging studies including CTH, CT C-spine were ordered from triage I independently visualized and interpreted imaging. I agree with the radiologist interpretation  Additional history obtained from partner at bedside.    Social Determinants of Health: Patient lives independently   Disposition:  Patient is overall well-appearing. Imaging negative. No other concerns. Patient is given tylenol/toradol for headache and R lateral neck pain.  She is encouraged to take alternating Tylenol ibuprofen at home for pain and is informed that she will likely be sore tomorrow.  Also instructed that she can use ice to help with the pain.  Patient is given discharge instructions and return precautions related to head injury.  Advised to follow-up with her primary care physician within 1 to 2 weeks.  Patient endorses understanding, all questions answered to patient satisfaction.  Co morbidities that complicate the patient evaluation  Past Medical History:  Diagnosis Date   Anemia    No pertinent past medical history    Pelvic pain in female      Medicines Meds ordered this encounter  Medications   acetaminophen (TYLENOL) tablet 1,000 mg   ketorolac (TORADOL) 15 MG/ML  injection 15 mg    I have reviewed the patients home medicines and have made adjustments as needed  Problem List / ED Course: Problem List Items Addressed This Visit   None Visit Diagnoses     Injury of head, initial encounter    -  Primary                   This note was created using dictation software, which may contain spelling or grammatical errors.    Audley Hose, MD 01/16/23 [phone]

## 2023-01-16 NOTE — Discharge Instructions (Signed)
Thank you for coming to [facility] Emergency Department. You were seen for fall and head injury. We did an exam and imaging, and these showed no acute findings.   You can alternate taking Tylenol and ibuprofen as needed for pain. You can take 650mg  tylenol (acetaminophen) every 4-6 hours, and 600 mg ibuprofen 3 times a day. You can utilize ice packs on your head or side of your neck if this helps your pain.  Please follow up with your primary care provider within 1 week.   Do not hesitate to return to the ED or call 911 if you experience: -Worsening symptoms -Sudden severe headache -Visual changes -Numbness/tingling, asymmetric weakness -Lightheadedness, passing out -Fevers/chills -Anything else that concerns you

## 2023-01-16 NOTE — ED Notes (Signed)
Pt verbalized understanding of d/c instructions, meds, and followup care. Denies questions. VSS, no distress noted. Steady gait to exit with all belongings.  ?

## 2023-09-09 ENCOUNTER — Other Ambulatory Visit: Payer: Self-pay

## 2023-09-09 ENCOUNTER — Emergency Department (HOSPITAL_BASED_OUTPATIENT_CLINIC_OR_DEPARTMENT_OTHER)
Admission: EM | Admit: 2023-09-09 | Discharge: 2023-09-10 | Disposition: A | Discharge status: Home / Self Care | Payer: Medicaid Other | Attending: Emergency Medicine | Admitting: Emergency Medicine

## 2023-09-09 DIAGNOSIS — R1031 Right lower quadrant pain: Secondary | ICD-10-CM | POA: Insufficient documentation

## 2023-09-09 DIAGNOSIS — M5441 Lumbago with sciatica, right side: Secondary | ICD-10-CM | POA: Diagnosis not present

## 2023-09-09 LAB — CBC
HCT: 41.3 % (ref 36.0–46.0)
Hemoglobin: 13.5 g/dL (ref 12.0–15.0)
MCH: 28.5 pg (ref 26.0–34.0)
MCHC: 32.7 g/dL (ref 30.0–36.0)
MCV: 87.1 fL (ref 80.0–100.0)
Platelets: 243 10*3/uL (ref 150–400)
RBC: 4.74 MIL/uL (ref 3.87–5.11)
RDW: 14 % (ref 11.5–15.5)
WBC: 6.8 10*3/uL (ref 4.0–10.5)
nRBC: 0 % (ref 0.0–0.2)

## 2023-09-09 LAB — URINALYSIS, ROUTINE W REFLEX MICROSCOPIC
Bilirubin Urine: NEGATIVE
Glucose, UA: NEGATIVE mg/dL
Hgb urine dipstick: NEGATIVE
Ketones, ur: NEGATIVE mg/dL
Leukocytes,Ua: NEGATIVE
Nitrite: NEGATIVE
Protein, ur: NEGATIVE mg/dL
Specific Gravity, Urine: 1.011 (ref 1.005–1.030)
pH: 6.5 (ref 5.0–8.0)

## 2023-09-09 NOTE — ED Triage Notes (Signed)
Pt POV from home reporting R side flank pain that began Sat night. Pt reports feeling numbness/tingling in R thigh Sat morning followed by the pain, denies any injury or trauma. Denies urinary sx, hx kidney stone 2001

## 2023-09-10 ENCOUNTER — Emergency Department (HOSPITAL_BASED_OUTPATIENT_CLINIC_OR_DEPARTMENT_OTHER): Payer: Medicaid Other

## 2023-09-10 LAB — BASIC METABOLIC PANEL
Anion gap: 8 (ref 5–15)
BUN: 11 mg/dL (ref 6–20)
CO2: 27 mmol/L (ref 22–32)
Calcium: 9 mg/dL (ref 8.9–10.3)
Chloride: 105 mmol/L (ref 98–111)
Creatinine, Ser: 0.82 mg/dL (ref 0.44–1.00)
GFR, Estimated: >60 mL/min (ref >60)
Glucose, Bld: 95 mg/dL (ref 70–99)
Potassium: 3.8 mmol/L (ref 3.5–5.1)
Sodium: 140 mmol/L (ref 135–145)

## 2023-09-10 MED ORDER — DEXAMETHASONE SODIUM PHOSPHATE 10 MG/ML IJ SOLN
10.0000 mg | Freq: Once | INTRAMUSCULAR | Status: AC
  Administered 2023-09-10: 10 mg via INTRAVENOUS
  Filled 2023-09-10: qty 1

## 2023-09-10 MED ORDER — HYDROMORPHONE HCL 1 MG/ML IJ SOLN
1.0000 mg | Freq: Once | INTRAMUSCULAR | Status: AC
  Administered 2023-09-10: 1 mg via INTRAVENOUS
  Filled 2023-09-10: qty 1

## 2023-09-10 MED ORDER — LACTATED RINGERS IV BOLUS
1000.0000 mL | Freq: Once | INTRAVENOUS | Status: AC
  Administered 2023-09-10: 1000 mL via INTRAVENOUS

## 2023-09-10 MED ORDER — KETOROLAC TROMETHAMINE 30 MG/ML IJ SOLN
15.0000 mg | Freq: Once | INTRAMUSCULAR | Status: AC
  Administered 2023-09-10: 15 mg via INTRAVENOUS
  Filled 2023-09-10: qty 1

## 2023-09-10 MED ORDER — ONDANSETRON HCL 4 MG/2ML IJ SOLN
4.0000 mg | Freq: Once | INTRAMUSCULAR | Status: AC
  Administered 2023-09-10: 4 mg via INTRAVENOUS
  Filled 2023-09-10: qty 2

## 2023-09-10 MED ORDER — FENTANYL CITRATE PF 50 MCG/ML IJ SOSY
50.0000 ug | PREFILLED_SYRINGE | Freq: Once | INTRAMUSCULAR | Status: AC
  Administered 2023-09-10: 50 ug via INTRAVENOUS
  Filled 2023-09-10: qty 1

## 2023-09-10 MED ORDER — METHOCARBAMOL 500 MG PO TABS: 500.0000 mg | ORAL_TABLET | Freq: Three times a day (TID) | ORAL | 0 refills | Status: DC | PRN

## 2023-09-10 MED ORDER — METHYLPREDNISOLONE 4 MG PO TBPK: ORAL_TABLET | ORAL | 0 refills | Status: DC

## 2023-09-10 NOTE — ED Notes (Signed)
RN reviewed discharge instructions with pt. Pt verbalized understanding and had no further questions. VSS upon discharge.

## 2023-09-10 NOTE — ED Notes (Signed)
Patient transported to CT 

## 2023-09-10 NOTE — Discharge Instructions (Signed)
Take the steroids and relaxers as prescribed.  As we discussed your pain may be from a pinched nerve in your back or you may have passed a kidney stone.  Follow-up with your doctor for an MRI if your symptoms persist.  Return to the ED sooner with worsening pain, weakness, numbness, tingling, bowel or bladder incontinence or any other concerns.

## 2023-09-10 NOTE — ED Provider Notes (Signed)
[place] EMERGENCY DEPARTMENT AT [facility] Provider Note   CSN: [id number] Arrival date & time: 09/09/23  2314     History  Chief Complaint  Patient presents with   Flank Pain    Renee [name] is a 47 y.o. female.  Patient with no medical history here with right sided flank pain that radiates to her right abdomen and leg for the past 2 days.  States that she awoke the morning of September 15 with some tingling and numbness to her right thigh.  Since then she has had pain to her right flank right lower abdomen that radiates down right leg.  No injury or trauma.  No history of chronic back problems.  Pain is to her right lower back.  No focal weakness, numbness or tingling.  No bowel or bladder incontinence.  No fever or vomiting.  No history of IV drug abuse or cancer.  States she is never had this kind of pain in the past.  Denies any history of chronic back problems.  She has had a kidney stone remotely.  Previous hysterectomy but still has ovaries.  Still has appendix and gallbladder.  Took an unknown pain medication at home without relief.  Denies any pain with urination or blood in the urine.  Denies any fever, chills, cough, runny nose or sore throat.  No chest pain or shortness of breath.  The history is provided by the patient.  Flank Pain Pertinent negatives include no chest pain, no abdominal pain, no headaches and no shortness of breath.       Home Medications Prior to Admission medications   Medication Sig Start Date End Date Taking? Authorizing Provider  cyclobenzaprine (FLEXERIL) 10 MG tablet Take 1 tablet (10 mg total) by mouth 3 (three) times daily as needed for muscle spasms. 02/03/17   Hayden Rasmussen, NP  naproxen (NAPROSYN) 500 MG tablet Take 1 tablet (500 mg total) by mouth 2 (two) times daily. 12/09/18   Petrucelli, Samantha R, PA-C  predniSONE (DELTASONE) 20 MG tablet Take 2 tablets (40 mg total) by mouth daily. 02/20/18   Hedges, Tinnie Gens, PA-C   traMADol (ULTRAM) 50 MG tablet Take 1 tablet (50 mg total) by mouth every 6 (six) hours as needed. 02/20/18   Hedges, Tinnie Gens, PA-C      Allergies    Shellfish allergy and Morphine and codeine    Review of Systems   Review of Systems  Constitutional:  Negative for activity change, appetite change and fever.  HENT:  Negative for congestion and postnasal drip.   Respiratory:  Negative for cough, chest tightness and shortness of breath.   Cardiovascular:  Negative for chest pain.  Gastrointestinal:  Negative for abdominal pain, nausea and vomiting.  Genitourinary:  Positive for flank pain. Negative for dysuria and hematuria.  Musculoskeletal:  Positive for arthralgias, back pain and myalgias.  Skin:  Negative for rash.  Neurological:  Negative for dizziness, weakness and headaches.   all other systems are negative except as noted in the HPI and PMH.    Physical Exam Updated Vital Signs BP (!) 170/98   Pulse 83   Temp 98.6 F (37 C) (Oral)   Resp 18   Ht 5\' 3"  (1.6 m)   Wt 129.7 kg   LMP 07/25/2015 (Exact Date)   SpO2 100%   BMI 50.66 kg/m  Physical Exam Vitals and nursing note reviewed.  Constitutional:      General: She is not in acute distress.    Appearance:  She is well-developed.  HENT:     Head: Normocephalic and atraumatic.     Mouth/Throat:     Pharynx: No oropharyngeal exudate.  Eyes:     Conjunctiva/sclera: Conjunctivae normal.     Pupils: Pupils are equal, round, and reactive to light.  Neck:     Comments: No meningismus. Cardiovascular:     Rate and Rhythm: Normal rate and regular rhythm.     Heart sounds: Normal heart sounds. No murmur heard. Pulmonary:     Effort: Pulmonary effort is normal. No respiratory distress.     Breath sounds: Normal breath sounds.  Abdominal:     Palpations: Abdomen is soft.     Tenderness: There is no abdominal tenderness. There is no guarding or rebound.  Musculoskeletal:        General: Tenderness present. Normal range  of motion.     Cervical back: Normal range of motion and neck supple.     Comments: R CVAT  5/5 strength in bilateral lower extremities. Ankle plantar and dorsiflexion intact. Great toe extension intact bilaterally. +2 DP and PT pulses. +2 patellar reflexes bilaterally. Normal gait.   Skin:    General: Skin is warm.  Neurological:     Mental Status: She is alert and oriented to person, place, and time.     Cranial Nerves: No cranial nerve deficit.     Motor: No abnormal muscle tone.     Coordination: Coordination normal.     Comments:  5/5 strength throughout. CN 2-12 intact.Equal grip strength.   Psychiatric:        Behavior: Behavior normal.     ED Results / Procedures / Treatments   Labs (all labs ordered are listed, but only abnormal results are displayed) Labs Reviewed  URINALYSIS, ROUTINE W REFLEX MICROSCOPIC - Abnormal; Notable for the following components:      Result Value   Color, Urine COLORLESS (*)    All other components within normal limits  BASIC METABOLIC PANEL  CBC    EKG None  Radiology CT L-SPINE NO CHARGE  Result Date: 09/10/2023 CLINICAL DATA:  Right-sided flank pain and numbness and tingling in right thigh EXAM: CT LUMBAR SPINE WITHOUT CONTRAST TECHNIQUE: Multidetector CT imaging of the lumbar spine was performed without intravenous contrast administration. Multiplanar CT image reconstructions were also generated. RADIATION DOSE REDUCTION: This exam was performed according to the departmental dose-optimization program which includes automated exposure control, adjustment of the mA and/or kV according to patient size and/or use of iterative reconstruction technique. COMPARISON:  None Available. FINDINGS: Segmentation: 5 lumbar type vertebrae. Alignment: No evidence of traumatic listhesis. Vertebrae: No acute fracture. Paraspinal and other soft tissues: See separate report for findings in the abdomen and pelvis. Disc levels: Spondylosis is at most mild.  Intervertebral disc space height is maintained. Mild facet arthropathy at L3-L4. No severe spinal canal narrowing. Neural foraminal narrowing is greatest bilaterally at L5-S1 where it is mild. IMPRESSION: No acute fracture in the lumbar spine. Electronically Signed   By: Minerva Fester M.D.   On: 09/10/2023 01:44   CT Renal Stone Study  Result Date: 09/10/2023 CLINICAL DATA:  Right-sided flank pain and numbness and tingling in right thigh EXAM: CT ABDOMEN AND PELVIS WITHOUT CONTRAST TECHNIQUE: Multidetector CT imaging of the abdomen and pelvis was performed following the standard protocol without IV contrast. RADIATION DOSE REDUCTION: This exam was performed according to the departmental dose-optimization program which includes automated exposure control, adjustment of the mA and/or kV according to patient  size and/or use of iterative reconstruction technique. COMPARISON:  Ultrasound 07/04/2012 and CT abdomen and pelvis 04/22/2004 FINDINGS: Lower chest: No acute abnormality. Hepatobiliary: Unremarkable liver. Normal gallbladder. No biliary dilation. Pancreas: Unremarkable. Spleen: Unremarkable. Adrenals/Urinary Tract: Normal adrenal glands. No urinary calculi or hydronephrosis. Bladder is unremarkable. Stomach/Bowel: Normal caliber large and small bowel. No bowel wall thickening. The appendix is normal. Small hiatal hernia. Vascular/Lymphatic: No significant vascular findings are present. No enlarged abdominal or pelvic lymph nodes. Reproductive: Hysterectomy. Other: No free intraperitoneal fluid or air. Musculoskeletal: No acute fracture. IMPRESSION: 1. No acute abnormality in the abdomen or pelvis. Electronically Signed   By: Minerva Fester M.D.   On: 09/10/2023 01:41    Procedures Procedures    Medications Ordered in ED Medications  fentaNYL (SUBLIMAZE) injection 50 mcg (has no administration in time range)  ondansetron (ZOFRAN) injection 4 mg (has no administration in time range)  lactated  ringers bolus 1,000 mL (has no administration in time range)  ketorolac (TORADOL) 30 MG/ML injection 15 mg (has no administration in time range)    ED Course/ Medical Decision Making/ A&P                                 Medical Decision Making Amount and/or Complexity of Data Reviewed Labs: ordered. Decision-making details documented in ED Course. Radiology: ordered and independent interpretation performed. Decision-making details documented in ED Course. ECG/medicine tests: ordered and independent interpretation performed. Decision-making details documented in ED Course.  Risk Prescription drug management.   2 days of right-sided low back pain that radiates to right lower abdomen and right leg.  Intact distal strength, sensation, pulses and reflexes.  Low concern for cord compression or cauda equina.  Urinalysis is negative without blood or infection  Creatinine is normal. CT scan is negative for obvious explanation of her flank pain.  No kidney stone.  No aortic aneurysm.  No obvious fracture to lumbar spine or large bulging disc.  Results reviewed and interpreted by me.  On recheck, patient feels improved.  Her back pain has resolved.  Intact distal strength, sensation, pulses and reflexes.  Low suspicion for cord compression or cauda equina.  No indication for emergent MRI today.  Consider possibility of passed kidney stone versus musculoskeletal back pain or lumbar radiculopathy.  Will treat with steroids and muscle relaxers.  Follow-up with PCP for MRI if symptoms persist.  Return to the ED sooner with worsening pain, weakness, numbness, tingling, bowel or bladder incontinence or other concerns.        Final Clinical Impression(s) / ED Diagnoses Final diagnoses:  Acute right-sided low back pain with right-sided sciatica    Rx / DC Orders ED Discharge Orders     None         Clarnce Homan, Jeannett Senior, MD 09/10/23 0225

## 2023-09-13 ENCOUNTER — Telehealth: Payer: Medicaid Other | Admitting: Emergency Medicine

## 2023-09-13 ENCOUNTER — Ambulatory Visit (INDEPENDENT_AMBULATORY_CARE_PROVIDER_SITE_OTHER): Payer: Medicaid Other | Admitting: Family Medicine

## 2023-09-13 ENCOUNTER — Telehealth: Payer: Self-pay

## 2023-09-13 ENCOUNTER — Encounter: Payer: Self-pay | Admitting: Family Medicine

## 2023-09-13 VITALS — BP 146/100 | HR 87 | Ht 63.0 in | Wt 284.0 lb

## 2023-09-13 DIAGNOSIS — M5416 Radiculopathy, lumbar region: Secondary | ICD-10-CM | POA: Diagnosis not present

## 2023-09-13 DIAGNOSIS — M25551 Pain in right hip: Secondary | ICD-10-CM

## 2023-09-13 DIAGNOSIS — M5441 Lumbago with sciatica, right side: Secondary | ICD-10-CM | POA: Diagnosis not present

## 2023-09-13 DIAGNOSIS — R29898 Other symptoms and signs involving the musculoskeletal system: Secondary | ICD-10-CM

## 2023-09-13 MED ORDER — GABAPENTIN 300 MG PO CAPS: 300.0000 mg | ORAL_CAPSULE | Freq: Three times a day (TID) | ORAL | 3 refills | Status: DC | PRN

## 2023-09-13 MED ORDER — LORAZEPAM 0.5 MG PO TABS: ORAL_TABLET | ORAL | 0 refills | Status: AC

## 2023-09-13 MED ORDER — TIZANIDINE HCL 4 MG PO TABS: 4.0000 mg | ORAL_TABLET | Freq: Three times a day (TID) | ORAL | 1 refills | Status: DC | PRN

## 2023-09-13 MED ORDER — TRAMADOL HCL 50 MG PO TABS: 50.0000 mg | ORAL_TABLET | Freq: Three times a day (TID) | ORAL | 0 refills | Status: DC

## 2023-09-13 MED ORDER — PREDNISONE 50 MG PO TABS: 50.0000 mg | ORAL_TABLET | Freq: Every day | ORAL | 0 refills | Status: AC

## 2023-09-13 NOTE — Telephone Encounter (Signed)
Called pt to troubleshoot our issue w/ being able to run her coverage w/ the Medicaid ID we have. She was going to call to try to figure this out and give [place] a call back. Advised that she could do self-pay or go back to the ED. She verbalized understanding.

## 2023-09-13 NOTE — Progress Notes (Signed)
I, Stevenson Clinch, CMA acting as a scribe for Clementeen Graham, MD.  Renee [name] is a 47 y.o. female who presents to [facility] at [facility] today for LBP ongoing since Sept 15th. Pt was seen at the [facility] ED on the 16th. Pt locates pain to right side lower back. Sx worsening since onset. Pain radiates into the gluteal region, hip, groin, and leg but not beyond the knee. No relief with meds. Sx are severe causing nausea.   Radiating pain yes LE numbness/tingling: thigh LE weakness: intermittently Aggravates: constant Treatments tried: flexeril, prednisone, tramadol, naproxen  She rates her pain as severe 10 out of 10 at times.  She notes especially right leg weakness with difficulty walking at times. No bowel or bladder dysfunction. Dx testing: 09/10/23 L-spine & renal stone CT  09/09/23 Labs  Pertinent review of systems: No fevers or chills  Relevant historical information: Obesity   Exam:  BP (!) 146/100   Pulse 87   Ht 5\' 3"  (1.6 m)   Wt 284 lb (128.8 kg)   LMP 07/25/2015 (Exact Date)   SpO2 97%   BMI 50.31 kg/m  General: Well Developed, well nourished, in pain appearing.  Patient is having trouble finding a comfortable position.  MSK: L-spine nontender palpation spinal midline.  Mild tender palpation right lumbar paraspinal musculature. Decreased lumbar motion pain with extension. Lower extremity strength is decreased. Hip flexion: 5/5 bilaterally.   Hip abduction 5/5 bilaterally. Hip adduction 5/5 bilaterally. Knee extension right 4/5, left 5/5. Knee flexion right 4/5 left 5/5. Foot dorsiflexion right 3/5 left 5/5 Great toe dorsiflexion right 3/5 left 5/5. Foot plantarflexion right 4/5 left 5/5.  Reflexes generally decreased bilaterally right worse than left.  Sensation intact distally.  Lab and Radiology Results No results found for this or any previous visit (from the past 72 hour(s)). CT L-SPINE NO CHARGE  Result Date:  09/10/2023 CLINICAL DATA:  Right-sided flank pain and numbness and tingling in right thigh EXAM: CT LUMBAR SPINE WITHOUT CONTRAST TECHNIQUE: Multidetector CT imaging of the lumbar spine was performed without intravenous contrast administration. Multiplanar CT image reconstructions were also generated. RADIATION DOSE REDUCTION: This exam was performed according to the departmental dose-optimization program which includes automated exposure control, adjustment of the mA and/or kV according to patient size and/or use of iterative reconstruction technique. COMPARISON:  None Available. FINDINGS: Segmentation: 5 lumbar type vertebrae. Alignment: No evidence of traumatic listhesis. Vertebrae: No acute fracture. Paraspinal and other soft tissues: See separate report for findings in the abdomen and pelvis. Disc levels: Spondylosis is at most mild. Intervertebral disc space height is maintained. Mild facet arthropathy at L3-L4. No severe spinal canal narrowing. Neural foraminal narrowing is greatest bilaterally at L5-S1 where it is mild. IMPRESSION: No acute fracture in the lumbar spine. Electronically Signed   By: Minerva Fester M.D.   On: 09/10/2023 01:44   CT Renal Stone Study  Result Date: 09/10/2023 CLINICAL DATA:  Right-sided flank pain and numbness and tingling in right thigh EXAM: CT ABDOMEN AND PELVIS WITHOUT CONTRAST TECHNIQUE: Multidetector CT imaging of the abdomen and pelvis was performed following the standard protocol without IV contrast. RADIATION DOSE REDUCTION: This exam was performed according to the departmental dose-optimization program which includes automated exposure control, adjustment of the mA and/or kV according to patient size and/or use of iterative reconstruction technique. COMPARISON:  Ultrasound 07/04/2012 and CT abdomen and pelvis 04/22/2004 FINDINGS: Lower chest: No acute abnormality. Hepatobiliary: Unremarkable liver. Normal gallbladder. No biliary dilation.  Pancreas: Unremarkable.  Spleen: Unremarkable. Adrenals/Urinary Tract: Normal adrenal glands. No urinary calculi or hydronephrosis. Bladder is unremarkable. Stomach/Bowel: Normal caliber large and small bowel. No bowel wall thickening. The appendix is normal. Small hiatal hernia. Vascular/Lymphatic: No significant vascular findings are present. No enlarged abdominal or pelvic lymph nodes. Reproductive: Hysterectomy. Other: No free intraperitoneal fluid or air. Musculoskeletal: No acute fracture. IMPRESSION: 1. No acute abnormality in the abdomen or pelvis. Electronically Signed   By: Minerva Fester M.D.   On: 09/10/2023 01:41    I, Clementeen Graham, personally (independently) visualized and performed the interpretation of the images attached in this note.  Personal interpretation of lumbar spine and right hip images available on CT scan above show minimal degenerative changes with no acute fractures or severe findings.  Assessment and Plan: 47 y.o. female with severe right low back pain radiating to the anterior right thigh associated with right leg weakness.  This is severe ongoing for the last week and worsening since yesterday.  She was seen in the emergency room on September 17.  At that time her clinical presentation was consistent with kidney stone however CT scan and urinalysis did not show any evidence of kidney stone.  Today her symptoms are consistent with either severe right hip pathology or significant lumbar radiculopathy.  The location of pain would be consistent with L2 but her weakness is more consistent with L4, L5, and S1 nerve roots.  There is a mismatch here.  However her symptoms are severe and worsening.  I am trying to arrange for an urgent MRI over the weekend but if her symptoms are worsening she should go back to the emergency room where a stat emergency MRI could potentially be done.  For symptom management will try using prednisone tramadol and gabapentin.  Also have prescribed tizanidine.  Caution against  taking all of these sedating medications all at once.  For MRI claustrophobia have also prescribed lorazepam again this is sedating and should not be used with all these medicines all at once.   PDMP reviewed during this encounter. Orders Placed This Encounter  Procedures   MR Lumbar Spine Wo Contrast    Standing Status:   Future    Standing Expiration Date:   09/12/2024    Order Specific Question:   What is the patient's sedation requirement?    Answer:   Anti-anxiety    Order Specific Question:   Does the patient have a pacemaker or implanted devices?    Answer:   No    Order Specific Question:   Preferred imaging location?    Answer:   [facility] (table limit-350lbs)   MR HIP RIGHT WO CONTRAST    Standing Status:   Future    Standing Expiration Date:   09/12/2024    Order Specific Question:   What is the patient's sedation requirement?    Answer:   Anti-anxiety    Order Specific Question:   Does the patient have a pacemaker or implanted devices?    Answer:   No    Order Specific Question:   Preferred imaging location?    Answer:   [facility] (table limit-350lbs)   Ambulatory referral to Physical Therapy    Referral Priority:   Routine    Referral Type:   Physical Medicine    Referral Reason:   Specialty Services Required    Requested Specialty:   Physical Therapy    Number of Visits Requested:   1   Meds ordered this encounter  Medications   predniSONE (DELTASONE) 50 MG tablet    Sig: Take 1 tablet (50 mg total) by mouth daily.    Dispense:  5 tablet    Refill:  0   traMADol (ULTRAM) 50 MG tablet    Sig: Take 1 tablet (50 mg total) by mouth every 8 (eight) hours.    Dispense:  15 tablet    Refill:  0   gabapentin (NEURONTIN) 300 MG capsule    Sig: Take 1 capsule (300 mg total) by mouth 3 (three) times daily as needed.    Dispense:  90 capsule    Refill:  3   tiZANidine (ZANAFLEX) 4 MG tablet    Sig: Take 1 tablet (4 mg total) by mouth every 8  (eight) hours as needed.    Dispense:  30 tablet    Refill:  1   LORazepam (ATIVAN) 0.5 MG tablet    Sig: 1-2 tabs 30 - 60 min prior to MRI. Do not drive with this medicine.    Dispense:  4 tablet    Refill:  0     Discussed warning signs or symptoms. Please see discharge instructions. Patient expresses understanding.   The above documentation has been reviewed and is accurate and complete Clementeen Graham, M.D.

## 2023-09-13 NOTE — Patient Instructions (Addendum)
  Hulen Skains, thank you for joining Cathlyn Parsons, NP for today's virtual visit.  While this provider is not your primary care provider (PCP), if your PCP is located in our provider database this encounter information will be shared with them immediately following your visit.   A [facility] MyChart account gives you access to today's visit and all your visits, tests, and labs performed at [facility] " click here if you don't have a [facility] MyChart account or go to [email or web address]  Consent: (Patient) Renee [name] provided verbal consent for this virtual visit at the beginning of the encounter.  Current Medications:  Current Outpatient Medications:    cyclobenzaprine (FLEXERIL) 10 MG tablet, Take 1 tablet (10 mg total) by mouth 3 (three) times daily as needed for muscle spasms., Disp: 20 tablet, Rfl: 0   methocarbamol (ROBAXIN) 500 MG tablet, Take 1 tablet (500 mg total) by mouth every 8 (eight) hours as needed for muscle spasms., Disp: 20 tablet, Rfl: 0   methylPREDNISolone (MEDROL DOSEPAK) 4 MG TBPK tablet, As directed, Disp: 21 each, Rfl: 0   naproxen (NAPROSYN) 500 MG tablet, Take 1 tablet (500 mg total) by mouth 2 (two) times daily., Disp: 10 tablet, Rfl: 0   predniSONE (DELTASONE) 20 MG tablet, Take 2 tablets (40 mg total) by mouth daily., Disp: 10 tablet, Rfl: 0   traMADol (ULTRAM) 50 MG tablet, Take 1 tablet (50 mg total) by mouth every 6 (six) hours as needed., Disp: 10 tablet, Rfl: 0   Medications ordered in this encounter:  No orders of the defined types were placed in this encounter.    *If you need refills on other medications prior to your next appointment, please contact your pharmacy*  Follow-Up: Call back or seek an in-person evaluation if the symptoms worsen or if the condition fails to improve as anticipated.  [facility] [phone]  Other Instructions Follow up with sports medicine. 2 clinic options are below, I  recommend you call [facility] first - sometimes they have same day appointments:   [facility]  [address] [place], [place], [place] [address] [phone]  [facility] at [facility] [address] [place], [place] [address] [phone]   If you have been instructed to have an in-person evaluation today at a local Urgent Care facility, please use the link below. It will take you to a list of all of our available [facility], including address, phone number and hours of operation. Please do not delay care.  [facility]  If you or a family member do not have a primary care provider, use the link below to schedule a visit and establish care. When you choose a [facility] primary care physician or advanced practice provider, you gain a long-term partner in health. Find a Primary Care Provider  Learn more about [facility]'s in-office and virtual care options: [facility] - Get Care Now

## 2023-09-13 NOTE — Progress Notes (Addendum)
Virtual Visit Consent   REMII [name], you are scheduled for a virtual visit with a [facility] provider today. Just as with appointments in the office, your consent must be obtained to participate. Your consent will be active for this visit and any virtual visit you may have with one of our providers in the next 365 days. If you have a MyChart account, a copy of this consent can be sent to you electronically.  As this is a virtual visit, video technology does not allow for your provider to perform a traditional examination. This may limit your provider's ability to fully assess your condition. If your provider identifies any concerns that need to be evaluated in person or the need to arrange testing (such as labs, EKG, etc.), we will make arrangements to do so. Although advances in technology are sophisticated, we cannot ensure that it will always work on either your end or our end. If the connection with a video visit is poor, the visit may have to be switched to a telephone visit. With either a video or telephone visit, we are not always able to ensure that we have a secure connection.  By engaging in this virtual visit, you consent to the provision of healthcare and authorize for your insurance to be billed (if applicable) for the services provided during this visit. Depending on your insurance coverage, you may receive a charge related to this service.  I need to obtain your verbal consent now. Are you willing to proceed with your visit today? Renee [name] has provided verbal consent on 09/13/2023 for a virtual visit (video or telephone). Cathlyn Parsons, NP  Date: 09/13/2023 9:13 AM  Virtual Visit via Video Note   I, Cathlyn Parsons, connected with  Renee [name]  ([id number], 1976/08/11) on 09/13/23 at  9:00 AM EDT by a video-enabled telemedicine application and verified that I am speaking with the correct person using two identifiers.  Location: Patient: Virtual Visit Location  Patient: Home Provider: Virtual Visit Location Provider: Home Office   I discussed the limitations of evaluation and management by telemedicine and the availability of in person appointments. The patient expressed understanding and agreed to proceed.    History of Present Illness: Renee [name] is a 47 y.o. who identifies as a female who was assigned female at birth, and is being seen today for R sided back pain that radiates down to her butt and then around to the front of her R leg and into her groin. Woke up with it morning of 09/07/23. Was seen in ED 09/09/23 and told it was maybe a "pinched nerve". Given steroids and muscle relaxers but pain continue to get worse. Denies change in bowel or bladder habits. Denies muscle weakness or foot drop.   HPI: HPI  Problems:  Patient Active Problem List   Diagnosis Date Noted   Obesity, morbid, BMI 50 or higher (HCC) 08/15/2020   Mild sleep apnea 01/19/2020   S/P hysterectomy 08/04/2015   Pelvic pain in female 11/17/2012    Allergies:  Allergies  Allergen Reactions   Shellfish Allergy Anaphylaxis and Hives   Morphine And Codeine Hives   Medications:  Current Outpatient Medications:    cyclobenzaprine (FLEXERIL) 10 MG tablet, Take 1 tablet (10 mg total) by mouth 3 (three) times daily as needed for muscle spasms., Disp: 20 tablet, Rfl: 0   methocarbamol (ROBAXIN) 500 MG tablet, Take 1 tablet (500 mg total) by mouth every 8 (eight) hours as needed  for muscle spasms., Disp: 20 tablet, Rfl: 0   methylPREDNISolone (MEDROL DOSEPAK) 4 MG TBPK tablet, As directed, Disp: 21 each, Rfl: 0   naproxen (NAPROSYN) 500 MG tablet, Take 1 tablet (500 mg total) by mouth 2 (two) times daily., Disp: 10 tablet, Rfl: 0   predniSONE (DELTASONE) 20 MG tablet, Take 2 tablets (40 mg total) by mouth daily., Disp: 10 tablet, Rfl: 0   traMADol (ULTRAM) 50 MG tablet, Take 1 tablet (50 mg total) by mouth every 6 (six) hours as needed., Disp: 10 tablet, Rfl:  0  Observations/Objective: Patient is well-developed, well-nourished in no acute distress.  Resting comfortably  at home.  Head is normocephalic, atraumatic.  No labored breathing.  Speech is clear and coherent with logical content.  Patient is alert and oriented at baseline.    Assessment and Plan: 1. Acute right-sided back pain with sciatica  No red flags that warrant ER evaluation but needs further eval - I recommend sports medicine. I do not think ER or urgent care can give her help she needs. Continue medicines rx by ER until she is re-evaluated in person.    Follow Up Instructions: I discussed the assessment and treatment plan with the patient. The patient was provided an opportunity to ask questions and all were answered. The patient agreed with the plan and demonstrated an understanding of the instructions.  A copy of instructions were sent to the patient via MyChart unless otherwise noted below.    The patient was advised to call back or seek an in-person evaluation if the symptoms worsen or if the condition fails to improve as anticipated.  Time:  I spent 10 minutes with the patient via telehealth technology discussing the above problems/concerns.    Cathlyn Parsons, NP

## 2023-09-13 NOTE — Patient Instructions (Addendum)
Thank you for coming in today.   Take the prednisone. STOP medrol dose pack.   Take gabapentin for nerve pain.   Take tramadol for severe pain.   Plan for MRI soon.   Let me know Monday if this plan is not working.   I've referred you to Physical Therapy.  Let us know if you don't hear from them in one week.

## 2023-09-16 ENCOUNTER — Encounter: Payer: Self-pay | Admitting: Physical Therapy

## 2023-09-16 ENCOUNTER — Other Ambulatory Visit: Payer: Self-pay

## 2023-09-16 ENCOUNTER — Ambulatory Visit: Payer: Medicaid Other | Attending: Family Medicine | Admitting: Physical Therapy

## 2023-09-16 DIAGNOSIS — M62838 Other muscle spasm: Secondary | ICD-10-CM | POA: Diagnosis not present

## 2023-09-16 DIAGNOSIS — R293 Abnormal posture: Secondary | ICD-10-CM | POA: Insufficient documentation

## 2023-09-16 DIAGNOSIS — M5416 Radiculopathy, lumbar region: Secondary | ICD-10-CM | POA: Diagnosis present

## 2023-09-16 DIAGNOSIS — M62830 Muscle spasm of back: Secondary | ICD-10-CM

## 2023-09-16 DIAGNOSIS — M25551 Pain in right hip: Secondary | ICD-10-CM | POA: Insufficient documentation

## 2023-09-16 NOTE — Therapy (Addendum)
OUTPATIENT PHYSICAL THERAPY THORACOLUMBAR EVALUATION   Patient Name: Renee [name] MRN: [id number] DOB:17-Sep-1976, 47 y.o., female Today's Date: 09/16/2023  END OF SESSION:  PT End of Session - 09/16/23 1708     Visit Number 1    Authorization Type  MEDICAID- [id number]    PT Start Time 1238    PT Stop Time 1319    PT Time Calculation (min) 41 min             Past Medical History:  Diagnosis Date   Anemia    No pertinent past medical history    Pelvic pain in female    Past Surgical History:  Procedure Laterality Date   BILATERAL SALPINGECTOMY N/A 08/04/2015   Procedure: LEFT SALPINGECTOMY;  Surgeon: Mitchel Honour, DO;  Location: [facility];  Service: Gynecology;  Laterality: N/A;   CESAREAN SECTION     ENDOMETRIAL ABLATION     LAPAROSCOPIC ASSISTED VAGINAL HYSTERECTOMY N/A 08/04/2015   Procedure: LAPAROSCOPIC ASSISTED VAGINAL HYSTERECTOMY;  Surgeon: Mitchel Honour, DO;  Location: [facility];  Service: Gynecology;  Laterality: N/A;   LAPAROSCOPIC LYSIS OF ADHESIONS N/A 08/04/2015   Procedure: LAPAROSCOPIC LYSIS OF ADHESIONS;  Surgeon: Mitchel Honour, DO;  Location: [facility];  Service: Gynecology;  Laterality: N/A;   TUBAL LIGATION     WISDOM TOOTH EXTRACTION     Patient Active Problem List   Diagnosis Date Noted   Obesity, morbid, BMI 50 or higher (HCC) 08/15/2020   Mild sleep apnea 01/19/2020   S/P hysterectomy 08/04/2015   Pelvic pain in female 11/17/2012    REFERRING PROVIDER: Rodolph Bong, MDCorey, Michel Harrow, MD  REFERRING DIAG: M54.16 (ICD-10-CM) - Lumbar radiculitis M25.551 (ICD-10-CM) - Pain of right hip  Rationale for Evaluation and Treatment: Rehabilitation  THERAPY DIAG:  Other muscle spasm  Muscle spasm of back  Pain in right hip  Abnormal posture  ONSET DATE: 09/07/23  SUBJECTIVE:                                                                                                                                                                                            SUBJECTIVE STATEMENT: Patient present to therapy with Rt sided lumbar back pain that started two weeks ago. She woke up with numbness in her Rt hip flexor area and shooting pain into her quad. Patient increased her activity 2 weeks before this occurred ; she was walking 1.5 miles/day. She notices her leg will give away when she stands up after sitting for a long time. She has burning in her lateral Rt hip and numbness hip flexor area occasionally . Patient has not been able  to sleep at night; she is normally a side sleeper. While working patient has to take seated rest breaks due to pain.  PERTINENT HISTORY:  None  PAIN:  Are you having pain? Yes: NPRS scale: 7 (currently) 10(worst)/10 Pain location: Rt lower lumbar area; Rt hip flexor crease Pain description: Achy; sharp; shooting; dull Aggravating factors: Laying down in any position; driving; standing > [id number]; Bending Relieving factors: Medication, massage gun   PRECAUTIONS: None  RED FLAGS: None   WEIGHT BEARING RESTRICTIONS: No  FALLS:  Has patient fallen in last 6 months? 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  PLOF: Independent  PATIENT GOALS: To help relieve pain to go away  NEXT MD VISIT: PRN  OBJECTIVE:   DIAGNOSTIC FINDINGS:  None; MRI has been ordered  PATIENT SURVEYS:  Modified Oswestry 22/50; 44% perceived disability   SCREENING FOR RED FLAGS: Bowel or bladder incontinence: No Spinal tumors: No Cauda equina syndrome: No Compression fracture: No Abdominal aneurysm: No  COGNITION: Overall cognitive status: Within functional limits for tasks assessed     POSTURE: anterior pelvic tilt  PALPATION: Palpable taut bands of Rt piriformis and Rt erector spinae  LUMBAR ROM:   AROM eval  Flexion WFL  Extension 60% limited  Right lateral flexion Above knee joint line  Left lateral flexion Bend to knee joint line  Right rotation WFL  Left  rotation 60% limited   (Blank rows = not tested)   LOWER EXTREMITY MMT:    MMT Right eval Left eval  Hip flexion 3 4-  Hip extension    Hip abduction 4- 4  Hip adduction    Hip internal rotation    Hip external rotation    Knee flexion 4 pain 4  Knee extension 4 pain 4+  Ankle dorsiflexion    Ankle plantarflexion    Ankle inversion    Ankle eversion     (Blank rows = not tested)  LUMBAR SPECIAL TESTS:  Slump test: Negative  FUNCTIONAL TESTS:  5 times sit to stand: 29.43 UE on legs Timed up and go (TUG): 14.96  GAIT: Comments: Antalgic gait pattern; Decreased Lt LE step length  TODAY'S TREATMENT:                                                                                                                              DATE:  09/16/23: HEP established   Manual STM to Rt erector spinae & Rt piriformis  If treatment provided at initial evaluation, no treatment charged due to lack of authorization.       PATIENT EDUCATION:  Education details: hip mobility; POC Person educated: Patient Education method: Explanation, Demonstration, and Handouts Education comprehension: verbalized understanding, returned demonstration, and needs further education  HOME EXERCISE PROGRAM: Access Code: [email or web address] URL: [email or web address] Date: 09/16/2023 Prepared by: Claude Manges  Exercises - Standing 'L' Stretch at Counter  - 1 x daily -  7 x weekly - 3 sets - 8 reps - Seated Piriformis Stretch with Trunk Bend  - 1 x daily - 7 x weekly - 3 sets - 8 reps - 20 hold - Side Lunge with Counter Support  - 1 x daily - 7 x weekly - 3 sets - 8 reps - 20 hold - Seated Hamstring Stretch  - 1 x daily - 7 x weekly - 3 sets - 8 reps - 20 hold  ASSESSMENT:  CLINICAL IMPRESSION: Patient is a 47 y.o. female who was seen today for physical therapy evaluation and treatment for lumbar pain and Rt hip pain . Based on evaluation noted decreased strength of hip musculature, muscle spasms in  Rt erector spinae and piriformis, and decreased sitting & standing tolerance. Due to pain patient walks with an antalgic gait pattern and is unable to stand longer than 10 minutes. Since pain has started patient as been unable to sleep at night, complete her work duties, and walk for leisure. Patient will benefit from skilled PT to address the below impairments and improve overall function.    OBJECTIVE IMPAIRMENTS: Abnormal gait, decreased mobility, difficulty walking, decreased ROM, decreased strength, increased muscle spasms, and impaired flexibility.   ACTIVITY LIMITATIONS: carrying, bending, sitting, standing, sleeping, and stairs  PARTICIPATION LIMITATIONS: driving, shopping, and community activity  PERSONAL FACTORS: Fitness are also affecting patient's functional outcome.   REHAB POTENTIAL: Good  CLINICAL DECISION MAKING: Stable/uncomplicated  EVALUATION COMPLEXITY: Low   GOALS: Goals reviewed with patient? Yes  SHORT TERM GOALS: Target date: 10/14/2023 Patient will be independent with initial HEP. Baseline:  Goal status: INITIAL  2.  Patient will report > or = to 25% improvement in walking tolerance. Baseline: 5 minutes Goal status: INITIAL  3. Patient will be able to stand for > or = to 15 minutes without increase back and Rt LE pain. Baseline:10 minutes Goal status: INITIAL   LONG TERM GOALS: Target date: 11/11/2023 Patient will demonstrate independence in advanced HEP. Baseline:  Goal status: INITIAL  2.  Patient will report > or = to 50% improvement in walking tolerance. Baseline: 5 minutes Goal status: INITIAL  3. Patient Oswestry Score will be < or = to 12 for decreased perceived disability. Baseline: 22/50 Goal status: INITIAL  4.Patient will be able to stand for > or = to 20 minutes without increase back and Rt LE pain. Baseline: 10 minutes Goal status: INITIAL   PLAN:  PT FREQUENCY: 2x/week  PT DURATION: 8 weeks  PLANNED INTERVENTIONS:  Therapeutic exercises, Therapeutic activity, Neuromuscular re-education, Balance training, Gait training, Patient/Family education, Self Care, Joint mobilization, Joint manipulation, Stair training, Vestibular training, Canalith repositioning, Aquatic Therapy, Dry Needling, Electrical stimulation, Spinal manipulation, Spinal mobilization, Cryotherapy, Moist heat, Compression bandaging, scar mobilization, Splintting, Taping, Vasopneumatic device, Traction, Ultrasound, Biofeedback, Ionotophoresis 4mg /ml Dexamethasone, and Manual therapy.  PLAN FOR NEXT SESSION: Review HEP; Manual as needed; hip strengthening   Claude Manges, PT 09/16/23 5:16 PM  [facility] [address] [place], [place] [address] Phone # [phone] Fax [phone]

## 2023-09-17 ENCOUNTER — Telehealth: Payer: Self-pay | Admitting: Family Medicine

## 2023-09-17 MED ORDER — HYDROCODONE-ACETAMINOPHEN 5-325 MG PO TABS
1.0000 | ORAL_TABLET | Freq: Four times a day (QID) | ORAL | 0 refills | Status: AC | PRN
Start: 2023-09-17 — End: ?

## 2023-09-17 MED ORDER — VALACYCLOVIR HCL 1 G PO TABS: 1000.0000 mg | ORAL_TABLET | Freq: Three times a day (TID) | ORAL | 0 refills | Status: AC

## 2023-09-17 MED ORDER — PREGABALIN 75 MG PO CAPS: 75.0000 mg | ORAL_CAPSULE | Freq: Two times a day (BID) | ORAL | 3 refills | Status: AC | PRN

## 2023-09-17 NOTE — Telephone Encounter (Signed)
Talked to Patient before running anything else through insurance because it will be a few more days before I can get anything to go through or if they will take CT scans instead of X rays to approve. I also informed patient that her insurance stated they do not require any pre authorizations for advanced imaging at the ED and I wanted to know how she would want me to proceed.   Patient states that she thinks going to the ED is going to be in her best interest because she needs this imaging and she has other things going on. I will put this in patients MRI referral as well.

## 2023-09-17 NOTE — Telephone Encounter (Signed)
Rodolph Bong, MD      09/17/23  9:47 AM Note I called and spoke with Renee [name]. This rash could be shingles could be something else.  She is gena send me a picture of her rash.  If it is shingles we do not have time to wait and should be treated immediately with Valtrex. I still would like to proceed to MRI as she has weakness.  We had some issues with her insurance information and I think MRI authorization was delayed as a result.  Or change in medicines around.  She could not tolerate gabapentin or switching to Lyrica.  I am adding hydrocodone instead of tramadol and she should discontinue the tizanidine as there is a drug interaction with Valtrex.  If her symptoms are escalating she should go to the emergency room.   We discussed with our authorization specialist regarding the status of her MRI

## 2023-09-17 NOTE — Telephone Encounter (Signed)
Patient called stating that as this point, the current medications prescribed have not helped. She numbness has gotten worse and the pain has not improved. She also has a rash that began yesterday where the pain is and towards her groin area.  Please advise on next steps.

## 2023-09-17 NOTE — Telephone Encounter (Signed)
I called and spoke with Renee [name]. This rash could be shingles could be something else.  She is gena send me a picture of her rash.  If it is shingles we do not have time to wait and should be treated immediately with Valtrex. I still would like to proceed to MRI as she has weakness.  We had some issues with her insurance information and I think MRI authorization was delayed as a result.  Or change in medicines around.  She could not tolerate gabapentin or switching to Lyrica.  I am adding hydrocodone instead of tramadol and she should discontinue the tizanidine as there is a drug interaction with Valtrex.  If her symptoms are escalating she should go to the emergency room.  We discussed with our authorization specialist regarding the status of her MRI

## 2023-09-17 NOTE — Addendum Note (Signed)
Addended by: Evon Slack on: 09/17/2023 10:23 AM   Modules accepted: Orders

## 2023-09-17 NOTE — Telephone Encounter (Signed)
They are in process.

## 2023-09-18 ENCOUNTER — Other Ambulatory Visit: Payer: Self-pay

## 2023-09-18 ENCOUNTER — Emergency Department (HOSPITAL_BASED_OUTPATIENT_CLINIC_OR_DEPARTMENT_OTHER)
Admission: EM | Admit: 2023-09-18 | Discharge: 2023-09-18 | Disposition: A | Discharge status: Home / Self Care | Payer: Medicaid Other | Attending: Emergency Medicine | Admitting: Emergency Medicine

## 2023-09-18 ENCOUNTER — Encounter (HOSPITAL_BASED_OUTPATIENT_CLINIC_OR_DEPARTMENT_OTHER): Payer: Self-pay | Admitting: Emergency Medicine

## 2023-09-18 DIAGNOSIS — M545 Low back pain, unspecified: Secondary | ICD-10-CM | POA: Diagnosis present

## 2023-09-18 DIAGNOSIS — M5441 Lumbago with sciatica, right side: Secondary | ICD-10-CM | POA: Diagnosis not present

## 2023-09-18 MED ORDER — HYDROMORPHONE HCL 1 MG/ML IJ SOLN
1.0000 mg | Freq: Once | INTRAMUSCULAR | Status: AC
  Administered 2023-09-18: 1 mg via INTRAMUSCULAR
  Filled 2023-09-18: qty 1

## 2023-09-18 MED ORDER — ONDANSETRON 4 MG PO TBDP
8.0000 mg | ORAL_TABLET | Freq: Once | ORAL | Status: DC
  Filled 2023-09-18: qty 2

## 2023-09-18 MED ORDER — LIDOCAINE 5 % EX PTCH
1.0000 | MEDICATED_PATCH | CUTANEOUS | Status: DC
  Administered 2023-09-18: 1 via TRANSDERMAL
  Filled 2023-09-18: qty 1

## 2023-09-18 MED ORDER — KETOROLAC TROMETHAMINE 15 MG/ML IJ SOLN
15.0000 mg | Freq: Once | INTRAMUSCULAR | Status: AC
  Administered 2023-09-18: 15 mg via INTRAMUSCULAR
  Filled 2023-09-18: qty 1

## 2023-09-18 NOTE — Discharge Instructions (Signed)
Your exam today is overall reassuring.  No emergent cause of your back pain.  Continue to follow-up with your orthopedist to obtain the MRI.  Continue to take the pain medication as you are prescribed.  For any concerning symptoms return to the emergency room.

## 2023-09-18 NOTE — ED Triage Notes (Signed)
Pt endorses RT side lower back pain radiating to RT leg, recent tx for same. Slow gait to room. Took lyrica and oxycodone early am with no relief

## 2023-09-18 NOTE — ED Provider Notes (Signed)
[place] EMERGENCY DEPARTMENT AT [facility] Provider Note   CSN: [id number] Arrival date & time: 09/18/23  1029     History  Chief Complaint  Patient presents with   Back Pain    Renee [name] is a 47 y.o. female.  47 year old female presents today for concern of right-sided low back pain that radiates down right lower extremity.  This has been present now since 9/14 and has persisted.  She was seen in the emergency department initially after onset and had CT of her lumbar spine as well as renal stone study which were both reassuring.  She was discharged on Medrol Dosepak and followed up with sports medicine.  She has been attempting to get an MRI outpatient but it requires prior authorization and has been delayed.  She states she was recently prescribed a stronger pain medication yesterday but it has not provided any significant relief.  She denies fever, trauma, loss of bowel or bladder control, history of malignancy, history of IV drug use.  The history is provided by the patient and medical records. No language interpreter was used.       Home Medications Prior to Admission medications   Medication Sig Start Date End Date Taking? Authorizing Provider  cyclobenzaprine (FLEXERIL) 10 MG tablet Take 1 tablet (10 mg total) by mouth 3 (three) times daily as needed for muscle spasms. 02/03/17   Hayden Rasmussen, NP  HYDROcodone-acetaminophen (NORCO/VICODIN) 5-325 MG tablet Take 1 tablet by mouth every 6 (six) hours as needed. 09/17/23   Rodolph Bong, MD  LORazepam (ATIVAN) 0.5 MG tablet 1-2 tabs 30 - 60 min prior to MRI. Do not drive with this medicine. 09/13/23   Rodolph Bong, MD  naproxen (NAPROSYN) 500 MG tablet Take 1 tablet (500 mg total) by mouth 2 (two) times daily. Patient not taking: Reported on 09/16/2023 12/09/18   Petrucelli, Pleas Koch, PA-C  predniSONE (DELTASONE) 50 MG tablet Take 1 tablet (50 mg total) by mouth daily. 09/13/23   Rodolph Bong, MD  pregabalin  (LYRICA) 75 MG capsule Take 1 capsule (75 mg total) by mouth 2 (two) times daily as needed (nerve pain). 09/17/23   Rodolph Bong, MD  valACYclovir (VALTREX) 1000 MG tablet Take 1 tablet (1,000 mg total) by mouth 3 (three) times daily. 09/17/23   Rodolph Bong, MD      Allergies    Shellfish allergy and Morphine and codeine    Review of Systems   Review of Systems  Constitutional:  Negative for fever.  Gastrointestinal:  Negative for abdominal pain and nausea.  Genitourinary:  Negative for dysuria.  Musculoskeletal:  Positive for back pain. Negative for gait problem.  Neurological:  Negative for light-headedness.  All other systems reviewed and are negative.   Physical Exam Updated Vital Signs BP (!) 146/68 (BP Location: Right Arm)   Pulse 92   Temp 98.5 F (36.9 C) (Oral)   Resp 20   LMP 07/25/2015 (Exact Date)   SpO2 100%  Physical Exam Vitals and nursing note reviewed.  Constitutional:      General: She is not in acute distress.    Appearance: Normal appearance. She is not ill-appearing.  HENT:     Head: Normocephalic and atraumatic.     Nose: Nose normal.  Eyes:     Conjunctiva/sclera: Conjunctivae normal.  Cardiovascular:     Rate and Rhythm: Normal rate and regular rhythm.     Heart sounds: Normal heart sounds.  Pulmonary:  Effort: Pulmonary effort is normal. No respiratory distress.  Abdominal:     General: There is no distension.     Palpations: Abdomen is soft.     Tenderness: There is no abdominal tenderness.  Musculoskeletal:        General: No deformity. Normal range of motion.     Cervical back: Normal range of motion.     Comments: Lumbar spine without tenderness to palpation.  Full range of motion bilateral lower extremities.  There is tenderness to palpation over proximal right lower extremity.  Neurovascularly intact.  Skin:    Findings: No rash.  Neurological:     Mental Status: She is alert.     ED Results / Procedures / Treatments    Labs (all labs ordered are listed, but only abnormal results are displayed) Labs Reviewed - No data to display  EKG None  Radiology No results found.  Procedures Procedures    Medications Ordered in ED Medications  HYDROmorphone (DILAUDID) injection 1 mg (has no administration in time range)  ondansetron (ZOFRAN-ODT) disintegrating tablet 8 mg (has no administration in time range)  ketorolac (TORADOL) 15 MG/ML injection 15 mg (has no administration in time range)  lidocaine (LIDODERM) 5 % 1 patch (has no administration in time range)    ED Course/ Medical Decision Making/ A&P                                 Medical Decision Making Risk Prescription drug management.   46 year old female presents today for concern of right-sided low back pain.  Ongoing since 9/14.  No injury at that time.  No red flag signs or symptoms on exam or history concerning for cauda equina syndrome or spinal epidural abscess.  She had imaging done during recent ED visit which were negative for concerning pathology.  Pending workup outpatient with sports medicine.  Currently pending prior authorization for an MRI.  She was prescribed Norco yesterday.  She has taken 1 dose without significant relief.  Will provide her multimodal pain control and reevaluate. We do not have access to MRI here and she does not have any red flag signs or symptoms we will defer imaging at this time given she recently had CT imaging.  She reports improvement on reevaluation.  She has pain medication at home.  She will continue these and follow-up with sports medicine.  She is agreeable with this.  Discharged in stable condition.   Final Clinical Impression(s) / ED Diagnoses Final diagnoses:  Acute right-sided low back pain with right-sided sciatica    Rx / DC Orders ED Discharge Orders     None         Marita Kansas, PA-C 09/18/23 1334    Zadie Rhine, MD 09/18/23 1441

## 2023-09-18 NOTE — ED Notes (Signed)
Discharge instructions, pain management, and follow up care reviewed and explained, pt verbalized understanding and had no further questions on d/c. Pt caox4, ambulatory, NAD on d/c.

## 2023-09-19 ENCOUNTER — Other Ambulatory Visit: Payer: Self-pay

## 2023-09-19 ENCOUNTER — Emergency Department (HOSPITAL_COMMUNITY)
Admission: EM | Admit: 2023-09-19 | Discharge: 2023-09-19 | Disposition: A | Discharge status: Home / Self Care | Payer: Medicaid Other | Attending: Emergency Medicine | Admitting: Emergency Medicine

## 2023-09-19 ENCOUNTER — Emergency Department (HOSPITAL_COMMUNITY): Payer: Medicaid Other

## 2023-09-19 ENCOUNTER — Encounter (HOSPITAL_COMMUNITY): Payer: Self-pay | Admitting: Emergency Medicine

## 2023-09-19 DIAGNOSIS — M545 Low back pain, unspecified: Secondary | ICD-10-CM | POA: Diagnosis present

## 2023-09-19 DIAGNOSIS — M5441 Lumbago with sciatica, right side: Secondary | ICD-10-CM

## 2023-09-19 DIAGNOSIS — M5127 Other intervertebral disc displacement, lumbosacral region: Secondary | ICD-10-CM | POA: Insufficient documentation

## 2023-09-19 LAB — CBC WITH DIFFERENTIAL/PLATELET
Abs Immature Granulocytes: 0.18 10*3/uL — ABNORMAL HIGH (ref 0.00–0.07)
Basophils Absolute: 0.1 10*3/uL (ref 0.0–0.1)
Basophils Relative: 1 %
Eosinophils Absolute: 0.4 10*3/uL (ref 0.0–0.5)
Eosinophils Relative: 4 %
HCT: 43.5 % (ref 36.0–46.0)
Hemoglobin: 13.6 g/dL (ref 12.0–15.0)
Immature Granulocytes: 2 %
Lymphocytes Relative: 38 %
Lymphs Abs: 4 10*3/uL (ref 0.7–4.0)
MCH: 27.9 pg (ref 26.0–34.0)
MCHC: 31.3 g/dL (ref 30.0–36.0)
MCV: 89.1 fL (ref 80.0–100.0)
Monocytes Absolute: 0.8 10*3/uL (ref 0.1–1.0)
Monocytes Relative: 8 %
Neutro Abs: 5.1 10*3/uL (ref 1.7–7.7)
Neutrophils Relative %: 47 %
Platelets: 256 10*3/uL (ref 150–400)
RBC: 4.88 MIL/uL (ref 3.87–5.11)
RDW: 15 % (ref 11.5–15.5)
WBC: 10.6 10*3/uL — ABNORMAL HIGH (ref 4.0–10.5)
nRBC: 0 % (ref 0.0–0.2)

## 2023-09-19 LAB — BASIC METABOLIC PANEL
Anion gap: 8 (ref 5–15)
BUN: 12 mg/dL (ref 6–20)
CO2: 28 mmol/L (ref 22–32)
Calcium: 8.4 mg/dL — ABNORMAL LOW (ref 8.9–10.3)
Chloride: 102 mmol/L (ref 98–111)
Creatinine, Ser: 0.89 mg/dL (ref 0.44–1.00)
GFR, Estimated: >60 mL/min (ref >60)
Glucose, Bld: 105 mg/dL — ABNORMAL HIGH (ref 70–99)
Potassium: 3.7 mmol/L (ref 3.5–5.1)
Sodium: 138 mmol/L (ref 135–145)

## 2023-09-19 MED ORDER — LIDOCAINE 5 % EX PTCH
1.0000 | MEDICATED_PATCH | CUTANEOUS | Status: DC
  Administered 2023-09-19: 1 via TRANSDERMAL
  Filled 2023-09-19: qty 1

## 2023-09-19 MED ORDER — KETOROLAC TROMETHAMINE 15 MG/ML IJ SOLN
15.0000 mg | Freq: Once | INTRAMUSCULAR | Status: AC
  Administered 2023-09-19: 15 mg via INTRAVENOUS
  Filled 2023-09-19: qty 1

## 2023-09-19 MED ORDER — HYDROMORPHONE HCL 1 MG/ML IJ SOLN
1.0000 mg | Freq: Once | INTRAMUSCULAR | Status: AC
  Administered 2023-09-19: 1 mg via INTRAVENOUS
  Filled 2023-09-19: qty 1

## 2023-09-19 MED ORDER — PREDNISONE 20 MG PO TABS
40.0000 mg | ORAL_TABLET | Freq: Every day | ORAL | 0 refills | Status: AC
Start: 2023-09-19 — End: ?

## 2023-09-19 MED ORDER — OXYCODONE-ACETAMINOPHEN 5-325 MG PO TABS: 1.0000 | ORAL_TABLET | Freq: Four times a day (QID) | ORAL | 0 refills | Status: AC | PRN

## 2023-09-19 MED ORDER — OXYCODONE-ACETAMINOPHEN 5-325 MG PO TABS
1.0000 | ORAL_TABLET | Freq: Four times a day (QID) | ORAL | 0 refills | Status: DC | PRN
Start: 2023-09-19 — End: 2023-09-19

## 2023-09-19 MED ORDER — PREDNISONE 20 MG PO TABS: 40.0000 mg | ORAL_TABLET | Freq: Every day | ORAL | 0 refills | Status: DC

## 2023-09-19 NOTE — ED Provider Notes (Signed)
[place] EMERGENCY DEPARTMENT AT [facility] Provider Note   CSN: [id number] Arrival date & time: 09/19/23  1138     History Chief Complaint  Patient presents with   Back Pain    Renee [name] is a 47 y.o. female patient who presents to the emergency department today with lower back pain that radiates down the right leg.  Symptoms been present for 5 days.  She denies any injury, heavy lifting, IV drug use, alcohol use.  Patient states she is also endorsing associated right thigh numbness and today the right leg is now weak.  She is having difficulty walking secondary to her leg weakness on the right side.  She denies any fever, chills, bowel or bladder incontinence.   Back Pain      Home Medications Prior to Admission medications   Medication Sig Start Date End Date Taking? Authorizing Provider  oxyCODONE-acetaminophen (PERCOCET/ROXICET) 5-325 MG tablet Take 1 tablet by mouth every 6 (six) hours as needed for severe pain. 09/19/23  Yes Meredeth Ide, Srikar Chiang M, PA-C  predniSONE (DELTASONE) 20 MG tablet Take 2 tablets (40 mg total) by mouth daily. 09/19/23  Yes Meredeth Ide, Amedeo Detweiler M, PA-C  cyclobenzaprine (FLEXERIL) 10 MG tablet Take 1 tablet (10 mg total) by mouth 3 (three) times daily as needed for muscle spasms. 02/03/17   Hayden Rasmussen, NP  HYDROcodone-acetaminophen (NORCO/VICODIN) 5-325 MG tablet Take 1 tablet by mouth every 6 (six) hours as needed. 09/17/23   Rodolph Bong, MD  LORazepam (ATIVAN) 0.5 MG tablet 1-2 tabs 30 - 60 min prior to MRI. Do not drive with this medicine. 09/13/23   Rodolph Bong, MD  naproxen (NAPROSYN) 500 MG tablet Take 1 tablet (500 mg total) by mouth 2 (two) times daily. Patient not taking: Reported on 09/16/2023 12/09/18   Petrucelli, Pleas Koch, PA-C  predniSONE (DELTASONE) 50 MG tablet Take 1 tablet (50 mg total) by mouth daily. 09/13/23   Rodolph Bong, MD  pregabalin (LYRICA) 75 MG capsule Take 1 capsule (75 mg total) by mouth 2 (two) times daily as  needed (nerve pain). 09/17/23   Rodolph Bong, MD  valACYclovir (VALTREX) 1000 MG tablet Take 1 tablet (1,000 mg total) by mouth 3 (three) times daily. 09/17/23   Rodolph Bong, MD      Allergies    Shellfish allergy and Morphine and codeine    Review of Systems   Review of Systems  Musculoskeletal:  Positive for back pain.  All other systems reviewed and are negative.   Physical Exam Updated Vital Signs BP (!) 140/78 (BP Location: Right Arm)   Pulse 89   Temp 98.2 F (36.8 C) (Oral)   Resp 16   Ht 5\' 3"  (1.6 m)   Wt 128.8 kg   LMP 07/25/2015 (Exact Date)   SpO2 100%   BMI 50.30 kg/m  Physical Exam Vitals and nursing note reviewed.  Constitutional:      Appearance: Normal appearance.  HENT:     Head: Normocephalic and atraumatic.  Eyes:     General:        Right eye: No discharge.        Left eye: No discharge.     Conjunctiva/sclera: Conjunctivae normal.  Pulmonary:     Effort: Pulmonary effort is normal.  Musculoskeletal:     Comments: 5/5 strength to the lower left extremity.  There is decreased subjective sensation in the right lower extremity in addition to 3 out of 5 strength on the  right side.  Skin:    General: Skin is warm and dry.     Findings: No rash.  Neurological:     General: No focal deficit present.     Mental Status: She is alert.  Psychiatric:        Mood and Affect: Mood normal.        Behavior: Behavior normal.     ED Results / Procedures / Treatments   Labs (all labs ordered are listed, but only abnormal results are displayed) Labs Reviewed  CBC WITH DIFFERENTIAL/PLATELET - Abnormal; Notable for the following components:      Result Value   WBC 10.6 (*)    Abs Immature Granulocytes 0.18 (*)    All other components within normal limits  BASIC METABOLIC PANEL - Abnormal; Notable for the following components:   Glucose, Bld 105 (*)    Calcium 8.4 (*)    All other components within normal limits    EKG None  Radiology MR LUMBAR  SPINE WO CONTRAST  Result Date: 09/19/2023 CLINICAL DATA:  Initial evaluation for severe back pain with radiation into the right lower extremity, weakness. EXAM: MRI LUMBAR SPINE WITHOUT CONTRAST TECHNIQUE: Multiplanar, multisequence MR imaging of the lumbar spine was performed. No intravenous contrast was administered. COMPARISON:  Prior CT from 09/10/2023. FINDINGS: Segmentation: Standard. Lowest well-formed disc space labeled the L5-S1 level. Alignment: Trace scoliosis. Alignment otherwise normal preservation of the normal lumbar lordosis. No significant listhesis. Vertebrae: Vertebral body height maintained without acute or chronic fracture. Bone marrow signal intensity within normal limits. No discrete or worrisome osseous lesions or abnormal marrow edema. Conus medullaris and cauda equina: Conus extends to the T12-L1 level. Conus and cauda equina appear normal. Paraspinal and other soft tissues: Unremarkable. Disc levels: L1-2: Normal interspace. Mild bilateral facet hypertrophy. No canal or foraminal stenosis. L2-3: Normal interspace. Mild bilateral facet hypertrophy. No canal or foraminal stenosis. L3-4: Disc desiccation with mild disc bulge, slightly asymmetric to the right. Superimposed small right foraminal disc protrusion closely approximates the exiting right L3 nerve root (series 8, image 22). Mild to moderate bilateral facet hypertrophy. Resultant mild spinal stenosis. Mild bilateral L3 foraminal narrowing. L4-5: Disc desiccation with mild diffuse disc bulge. Superimposed small central to left subarticular disc protrusion indents the ventral thecal sac (series 8, image 27). Mild bilateral facet hypertrophy. Resultant mild spinal stenosis. Foramina remain patent. L5-S1: Disc desiccation with mild disc bulge. Superimposed right subarticular disc protrusion contacts and minimally displaces the descending right S1 nerve root in the right lateral recess (series 8, image 33). Mild bilateral facet  hypertrophy. Resultant mild to moderate right with mild left lateral recess stenosis. Central canal remains patent. Foramina remain patent as well. IMPRESSION: 1. Right subarticular disc protrusion at L5-S1, contacting and minimally displacing the descending right S1 nerve root in the right lateral recess. 2. Small right foraminal disc protrusion at L3-4, closely approximating and potentially irritating the exiting right L3 nerve root. 3. Small central to left subarticular disc protrusion at L4-5 with resultant mild spinal stenosis. Electronically Signed   By: Rise Mu M.D.   On: 09/19/2023 19:10    Procedures Procedures    Medications Ordered in ED Medications  lidocaine (LIDODERM) 5 % 1 patch (1 patch Transdermal Patch Applied 09/19/23 2000)  ketorolac (TORADOL) 15 MG/ML injection 15 mg (15 mg Intravenous Given 09/19/23 1255)  HYDROmorphone (DILAUDID) injection 1 mg (1 mg Intravenous Given 09/19/23 1525)  HYDROmorphone (DILAUDID) injection 1 mg (1 mg Intravenous Given 09/19/23 2000)  ED Course/ Medical Decision Making/ A&P Clinical Course as of 09/19/23 2001  Thu Sep 19, 2023  1700 CBC with Differential(!) There is evidence of leukocytosis.  [CF]  1700 Basic metabolic panel(!) Normal.  [CF]  2000 MR LUMBAR SPINE WO CONTRAST Personally ordered and interpreted the study.  There is certainly evidence of some disc retrusion and herniation likely causing her symptoms.  I do agree with the radiologist interpretation.  No evidence of cauda equina. [CF]    Clinical Course User Index [CF] Teressa Lower, PA-C   {   Click here for ABCD2, HEART and other calculators  Medical Decision Making ELSYE MCCOLLISTER is a 47 y.o. female patient who presents to the emergency department today for further evaluation of right-sided back pain with radiation down the right leg.  Patient has been seen evaluated in the emergency department yesterday for similar symptoms.  There was talk about  getting possible MRI but there was not MRI available at the location.  Patient was also seen by her primary care doctor for this roughly 1 week ago.  They were attempting to treat conservatively.  Patient states that she has had no improvement with any of the medications prescribed.  Concerned that the patient now is having right leg weakness which is clear on physical exam going to proceed with an MRI of the lumbar spine to further assess.  I have a low suspicion for cauda equina syndrome or epidural abscess at this time.  Patient does have apparent disc herniation. No evidence of cauda equina. Will treat with steroids, narcotic pain medication, and will have her follow up with neurosurgery in the outpatient setting. All questions and concerns addressed. She is safe for discharge.   Amount and/or Complexity of Data Reviewed Labs: ordered. Decision-making details documented in ED Course. Radiology: ordered.  Risk Prescription drug management.   Final Clinical Impression(s) / ED Diagnoses Final diagnoses:  Herniation of intervertebral disc between L5 and S1  Acute right-sided low back pain with right-sided sciatica    Rx / DC Orders ED Discharge Orders          Ordered    predniSONE (DELTASONE) 20 MG tablet  Daily        09/19/23 1958    oxyCODONE-acetaminophen (PERCOCET/ROXICET) 5-325 MG tablet  Every 6 hours PRN        09/19/23 1958              Honor Loh [place], [place] 09/19/23 2001    Margarita Grizzle, MD 09/22/23 [phone]

## 2023-09-19 NOTE — Telephone Encounter (Signed)
Patient went to the ED yesterday but due to being at [facility], they did not have the equipment to do an MRI. She would like to know the status of authorization so that she can have it done at an imaging center.

## 2023-09-19 NOTE — ED Triage Notes (Signed)
Pt arrived POV c/o severe back pain radiating down right leg, pt reports numbness in right leg that radiate down to toes. Pt reports pain has been ongoing since 09/07/23. Denies accidents/injury. Denies any other symptoms.

## 2023-09-19 NOTE — Telephone Encounter (Signed)
Spoke to pt and advised proceed to the main ED on [address]. Tell the provider the worsening s/s she is experiencing; LE weakness and numbness/tingling. She verbalized understanding and stated that she would try to go back the correct ED.

## 2023-09-19 NOTE — Discharge Instructions (Signed)
Please follow-up with Dr. Danielle Dess within the next week.  I will write you 2 prescriptions.  The first is for steroids which should help with the inflammation and help decrease pain.  I will use this instead of the gabapentin that we discussed.  I will also write you a prescription for Percocet.  I know you got a prescription for a couple of days ago now write you for a couple days more.  You may return to the emergency department for any worsening symptoms.

## 2023-09-20 ENCOUNTER — Telehealth: Payer: Self-pay | Admitting: Family Medicine

## 2023-09-20 DIAGNOSIS — M5416 Radiculopathy, lumbar region: Secondary | ICD-10-CM

## 2023-09-20 NOTE — Telephone Encounter (Signed)
Called and provided pt w/ Dr. Denyse Amass advise and the # for DRI. She verbalized understanding.

## 2023-09-20 NOTE — Telephone Encounter (Signed)
Lumbar spine results and next steps.  I see the results of the MRI from the emergency room.  I am so sorry it took so long to get this done and it was so inconvenient and obnoxious.  You have it looks like the right L3 nerve root being pinched which should cause the pain in the front of the thigh and you have right S1 nerve root being pinched which could cause the weakness you are feeling further down into your foot.    I have ordered an epidural steroid injection which should help this pain quite a lot.  You should hear soon from [facility] imaging about scheduling.  You can call them yourself at [phone].

## 2023-09-23 ENCOUNTER — Other Ambulatory Visit: Payer: Self-pay | Admitting: Family Medicine

## 2023-09-23 ENCOUNTER — Ambulatory Visit
Admission: RE | Admit: 2023-09-23 | Discharge: 2023-09-23 | Disposition: A | Discharge status: Home / Self Care | Payer: Medicaid Other | Source: Ambulatory Visit | Attending: Family Medicine | Admitting: Family Medicine

## 2023-09-23 DIAGNOSIS — M5416 Radiculopathy, lumbar region: Secondary | ICD-10-CM

## 2023-09-23 MED ORDER — METHYLPREDNISOLONE ACETATE 40 MG/ML INJ SUSP (RADIOLOG
80.0000 mg | Freq: Once | INTRAMUSCULAR | Status: AC
  Administered 2023-09-23: 80 mg via EPIDURAL

## 2023-09-23 MED ORDER — IOPAMIDOL (ISOVUE-M 200) INJECTION 41%
1.0000 mL | Freq: Once | INTRAMUSCULAR | Status: AC
  Administered 2023-09-23: 1 mL via EPIDURAL

## 2023-09-23 NOTE — Discharge Instructions (Signed)

## 2023-09-26 ENCOUNTER — Other Ambulatory Visit: Payer: Medicaid Other

## 2023-10-09 ENCOUNTER — Encounter: Payer: Self-pay | Admitting: Physical Therapy

## 2023-10-09 ENCOUNTER — Ambulatory Visit: Payer: Medicaid Other | Attending: Family Medicine | Admitting: Physical Therapy

## 2023-10-09 ENCOUNTER — Telehealth: Payer: Self-pay | Admitting: Family Medicine

## 2023-10-09 DIAGNOSIS — M25551 Pain in right hip: Secondary | ICD-10-CM | POA: Diagnosis present

## 2023-10-09 DIAGNOSIS — M62838 Other muscle spasm: Secondary | ICD-10-CM | POA: Diagnosis present

## 2023-10-09 DIAGNOSIS — M62830 Muscle spasm of back: Secondary | ICD-10-CM | POA: Diagnosis present

## 2023-10-09 DIAGNOSIS — R293 Abnormal posture: Secondary | ICD-10-CM | POA: Insufficient documentation

## 2023-10-09 NOTE — Telephone Encounter (Signed)
Forwarding to Dr. Corey to review and advise.  

## 2023-10-09 NOTE — Telephone Encounter (Signed)
Pt had epidural on 9/30, feels about 70% better. Wondering if Dr. Denyse Amass would recommend a second epidural at this point or what the next plan is.

## 2023-10-09 NOTE — Therapy (Signed)
OUTPATIENT PHYSICAL THERAPY THORACOLUMBAR TREATMENT   Patient Name: Renee [name] MRN: [id number] DOB:[birth year], 47 y.o., female Today's Date: 10/09/2023  END OF SESSION:  PT End of Session - 10/09/23 0846     Visit Number 2    Date for PT Re-Evaluation 11/11/23    Authorization Type [place] MEDICAID- [employer]    PT Start Time 0803    PT Stop Time 0845    PT Time Calculation (min) 42 min    Activity Tolerance Patient tolerated treatment well    Behavior During Therapy [facility] for tasks assessed/performed              Past Medical History:  Diagnosis Date   Anemia    No pertinent past medical history    Pelvic pain in female    Past Surgical History:  Procedure Laterality Date   BILATERAL SALPINGECTOMY N/A 08/04/2015   Procedure: LEFT SALPINGECTOMY;  Surgeon: Mitchel Honour, DO;  Location: [facility];  Service: Gynecology;  Laterality: N/A;   CESAREAN SECTION     ENDOMETRIAL ABLATION     LAPAROSCOPIC ASSISTED VAGINAL HYSTERECTOMY N/A 08/04/2015   Procedure: LAPAROSCOPIC ASSISTED VAGINAL HYSTERECTOMY;  Surgeon: Mitchel Honour, DO;  Location: [facility];  Service: Gynecology;  Laterality: N/A;   LAPAROSCOPIC LYSIS OF ADHESIONS N/A 08/04/2015   Procedure: LAPAROSCOPIC LYSIS OF ADHESIONS;  Surgeon: Mitchel Honour, DO;  Location: [facility];  Service: Gynecology;  Laterality: N/A;   TUBAL LIGATION     WISDOM TOOTH EXTRACTION     Patient Active Problem List   Diagnosis Date Noted   Obesity, morbid, BMI 50 or higher (HCC) 08/15/2020   Mild sleep apnea 01/19/2020   S/P hysterectomy 08/04/2015   Pelvic pain in female 11/17/2012    REFERRING PROVIDER: Rodolph Bong, MDCorey, Michel Harrow, MD  REFERRING DIAG: [phone] (ICD-10-CM) - Lumbar radiculitis M25.551 (ICD-10-CM) - Pain of right hip  Rationale for Evaluation and Treatment: Rehabilitation  THERAPY DIAG:  Other muscle spasm  Muscle spasm of back  Pain in right hip  Abnormal posture  ONSET DATE: 09/07/23  SUBJECTIVE:                                                                                                                                                                                            SUBJECTIVE STATEMENT: Patient got a epidermal 09/23/2023 and it has helped her pain a lot but she is still currently having pain. Pain is currently 4/10 on the Rt side on her low back and into her hip area. Pain is a pulling sensation  From eval: Patient present to therapy with Rt sided lumbar back pain that  started two weeks ago. She woke up with numbness in her Rt hip flexor area and shooting pain into her quad. Patient increased her activity 2 weeks before this occurred ; she was walking 1.5 miles/day. She notices her leg will give away when she stands up after sitting for a long time. She has burning in her lateral Rt hip and numbness hip flexor area occasionally . Patient has not been able to sleep at night; she is normally a side sleeper. While working patient has to take seated rest breaks due to pain.  PERTINENT HISTORY:  None  PAIN:  Are you having pain? Yes: NPRS scale: 7 (currently) 10(worst)/10 Pain location: Rt lower lumbar area; Rt hip flexor crease Pain description: Achy; sharp; shooting; dull Aggravating factors: Laying down in any position; driving; standing > 16-10RUEA; Bending Relieving factors: Medication, massage gun   PRECAUTIONS: None  RED FLAGS: None   WEIGHT BEARING RESTRICTIONS: No  FALLS:  Has patient fallen in last 6 months? 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 Right subarticular disc protrusion at L5-S1, contacting and minimally displacing the descending right S1 nerve root in the right lateral recess. 2. Small right foraminal disc protrusion at L3-4, closely approximating and potentially  irritating the exiting right L3 nerve root. 3. Small central to left subarticular disc protrusion at L4-5 with resultant mild spinal stenosis.  PATIENT SURVEYS:  Modified Oswestry 22/50; 44% perceived disability   SCREENING FOR RED FLAGS: Bowel or bladder incontinence: No Spinal tumors: No Cauda equina syndrome: No Compression fracture: No Abdominal aneurysm: No  COGNITION: Overall cognitive status: Within functional limits for tasks assessed     POSTURE: anterior pelvic tilt  PALPATION: Palpable taut bands of Rt piriformis and Rt erector spinae  LUMBAR ROM:   AROM eval  Flexion WFL  Extension 60% limited  Right lateral flexion Above knee joint line  Left lateral flexion Bend to knee joint line  Right rotation WFL  Left rotation 60% limited   (Blank rows = not tested)   LOWER EXTREMITY MMT:    MMT Right eval Left eval  Hip flexion 3 4-  Hip extension    Hip abduction 4- 4  Hip adduction    Hip internal rotation    Hip external rotation    Knee flexion 4 pain 4  Knee extension 4 pain 4+  Ankle dorsiflexion    Ankle plantarflexion    Ankle inversion    Ankle eversion     (Blank rows = not tested)  LUMBAR SPECIAL TESTS:  Slump test: Negative  FUNCTIONAL TESTS:  5 times sit to stand: 29.43 UE on legs Timed up and go (TUG): 14.96  GAIT: Comments: Antalgic gait pattern; Decreased Lt LE step length  TODAY'S TREATMENT:  DATE:  10/09/2023 Reviewed HEP exercises Prone Press Ups x 8 Prone hip extension knee flexed x 10 each Prone hip ER/IR with knee flexed x 10 Seated hamstring stretch 2 x 30 sec Seated QL stretch 3 x 10 sec  3 way SB stretch x 8 each way Seated PPT x 10 Seated PPT + Hip flexion x 8 Manual: Addaday lumbar spine   09/16/23: HEP established   Manual STM to Rt erector spinae & Rt piriformis  If treatment  provided at initial evaluation, no treatment charged due to lack of authorization.       PATIENT EDUCATION:  Education details: hip mobility; POC Person educated: Patient Education method: Explanation, Demonstration, and Handouts Education comprehension: verbalized understanding, returned demonstration, and needs further education  HOME EXERCISE PROGRAM: Access Code: [email or web address] URL: [email or web address] Date: 09/16/2023 Prepared by: Claude Manges  Exercises - Standing 'L' Stretch at Counter  - 1 x daily - 7 x weekly - 3 sets - 8 reps - Seated Piriformis Stretch with Trunk Bend  - 1 x daily - 7 x weekly - 3 sets - 8 reps - 20 hold - Side Lunge with Counter Support  - 1 x daily - 7 x weekly - 3 sets - 8 reps - 20 hold - Seated Hamstring Stretch  - 1 x daily - 7 x weekly - 3 sets - 8 reps - 20 hold  ASSESSMENT:  CLINICAL IMPRESSION: Today's treatment session focused on prone postioning and exercises. Patient received an epidural on 09/23/2023 and that provided her some relief but her pain is still intense and interfering with her going back to work. She has been doing her HEP exercises infrequently due to her pain she has been having. Exercises that required her to bend over caused her increased pain, but she still tried to do them. Patient tolerated prone well and was able to complete a full press up without symptoms radiating down her leg. Noted increased muscle spasms in lumbar spine and decreased lumbar mobility. Patient will benefit from skilled PT to address the below impairments and improve overall function.     OBJECTIVE IMPAIRMENTS: Abnormal gait, decreased mobility, difficulty walking, decreased ROM, decreased strength, increased muscle spasms, and impaired flexibility.   ACTIVITY LIMITATIONS: carrying, bending, sitting, standing, sleeping, and stairs  PARTICIPATION LIMITATIONS: driving, shopping, and community activity  PERSONAL FACTORS: Fitness are also  affecting patient's functional outcome.   REHAB POTENTIAL: Good  CLINICAL DECISION MAKING: Stable/uncomplicated  EVALUATION COMPLEXITY: Low   GOALS: Goals reviewed with patient? Yes  SHORT TERM GOALS: Target date: 10/14/2023 Patient will be independent with initial HEP. Baseline:  Goal status: INITIAL  2.  Patient will report > or = to 25% improvement in walking tolerance. Baseline: 5 minutes Goal status: INITIAL  3. Patient will be able to stand for > or = to 15 minutes without increase back and Rt LE pain. Baseline:10 minutes Goal status: INITIAL   LONG TERM GOALS: Target date: 11/11/2023 Patient will demonstrate independence in advanced HEP. Baseline:  Goal status: INITIAL  2.  Patient will report > or = to 50% improvement in walking tolerance. Baseline: 5 minutes Goal status: INITIAL  3. Patient Oswestry Score will be < or = to 12 for decreased perceived disability. Baseline: 22/50 Goal status: INITIAL  4.Patient will be able to stand for > or = to 20 minutes without increase back and Rt LE pain. Baseline: 10 minutes Goal status: INITIAL   PLAN:  PT FREQUENCY: 2x/week  PT DURATION: 8 weeks  PLANNED INTERVENTIONS: Therapeutic exercises, Therapeutic activity, Neuromuscular re-education, Balance training, Gait training, Patient/Family education, Self Care, Joint mobilization, Joint manipulation, Stair training, Vestibular training, Canalith repositioning, Aquatic Therapy, Dry Needling, Electrical stimulation, Spinal manipulation, Spinal mobilization, Cryotherapy, Moist heat, Compression bandaging, scar mobilization, Splintting, Taping, Vasopneumatic device, Traction, Ultrasound, Biofeedback, Ionotophoresis 4mg /ml Dexamethasone, and Manual therapy.  PLAN FOR NEXT SESSION: continue prone exercises; standing hip strengthening; core exercises   Claude Manges, PT 10/09/23 8:47 AM  [facility] [address], [address] [place], [place]  [address] Phone # [phone] Fax [phone]

## 2023-10-10 NOTE — Telephone Encounter (Signed)
I suggest waiting a month or two. We could repeat it in the future if needed.

## 2023-10-11 ENCOUNTER — Ambulatory Visit: Payer: Medicaid Other | Admitting: Physical Therapy

## 2023-10-11 ENCOUNTER — Encounter: Payer: Self-pay | Admitting: Physical Therapy

## 2023-10-11 DIAGNOSIS — R293 Abnormal posture: Secondary | ICD-10-CM

## 2023-10-11 DIAGNOSIS — M62838 Other muscle spasm: Secondary | ICD-10-CM | POA: Diagnosis not present

## 2023-10-11 DIAGNOSIS — M62830 Muscle spasm of back: Secondary | ICD-10-CM

## 2023-10-11 DIAGNOSIS — M25551 Pain in right hip: Secondary | ICD-10-CM

## 2023-10-11 NOTE — Therapy (Signed)
OUTPATIENT PHYSICAL THERAPY THORACOLUMBAR TREATMENT   Patient Name: Renee [name] MRN: [id number] DOB:07-14-1976, 47 y.o., female Today's Date: 10/11/2023  END OF SESSION:  PT End of Session - 10/11/23 0929     Visit Number 3    Date for PT Re-Evaluation 11/11/23    Authorization Type [place] MEDICAID- [facility]; Berkley Harvey req at 27 visits    PT Start Time 0847    PT Stop Time 0928    PT Time Calculation (min) 41 min    Activity Tolerance Patient tolerated treatment well    Behavior During Therapy [facility] for tasks assessed/performed               Past Medical History:  Diagnosis Date   Anemia    No pertinent past medical history    Pelvic pain in female    Past Surgical History:  Procedure Laterality Date   BILATERAL SALPINGECTOMY N/A 08/04/2015   Procedure: LEFT SALPINGECTOMY;  Surgeon: Mitchel Honour, DO;  Location: [facility];  Service: Gynecology;  Laterality: N/A;   CESAREAN SECTION     ENDOMETRIAL ABLATION     LAPAROSCOPIC ASSISTED VAGINAL HYSTERECTOMY N/A 08/04/2015   Procedure: LAPAROSCOPIC ASSISTED VAGINAL HYSTERECTOMY;  Surgeon: Mitchel Honour, DO;  Location: [facility];  Service: Gynecology;  Laterality: N/A;   LAPAROSCOPIC LYSIS OF ADHESIONS N/A 08/04/2015   Procedure: LAPAROSCOPIC LYSIS OF ADHESIONS;  Surgeon: Mitchel Honour, DO;  Location: [facility];  Service: Gynecology;  Laterality: N/A;   TUBAL LIGATION     WISDOM TOOTH EXTRACTION     Patient Active Problem List   Diagnosis Date Noted   Obesity, morbid, BMI 50 or higher (HCC) 08/15/2020   Mild sleep apnea 01/19/2020   S/P hysterectomy 08/04/2015   Pelvic pain in female 11/17/2012    REFERRING PROVIDER: Rodolph Bong, MDCorey, Michel Harrow, MD  REFERRING DIAG: M54.16 (ICD-10-CM) - Lumbar radiculitis M25.551 (ICD-10-CM) - Pain of right hip  Rationale for Evaluation and Treatment: Rehabilitation  THERAPY DIAG:  Other muscle spasm  Muscle spasm of back  Pain in right hip  Abnormal posture  ONSET DATE:  09/07/23  SUBJECTIVE:                                                                                                                                                                                           SUBJECTIVE STATEMENT: Patient reports she is feeling good today. She felt a lot better after last treatment session. Currently her pain is 3/10  From eval: Patient present to therapy with Rt sided lumbar back pain that started two weeks ago. She woke up with numbness in her Rt hip flexor area  and shooting pain into her quad. Patient increased her activity 2 weeks before this occurred ; she was walking 1.5 miles/day. She notices her leg will give away when she stands up after sitting for a long time. She has burning in her lateral Rt hip and numbness hip flexor area occasionally . Patient has not been able to sleep at night; she is normally a side sleeper. While working patient has to take seated rest breaks due to pain.  PERTINENT HISTORY:  None  PAIN: 10/11/2023 Are you having pain? Yes: NPRS scale: 3/10 Pain location: Rt lower lumbar area; Rt hip flexor crease Pain description: Achy; sharp; shooting; dull Aggravating factors: Laying down in any position; driving; standing > [id number]; Bending Relieving factors: Medication, massage gun   PRECAUTIONS: None  RED FLAGS: None   WEIGHT BEARING RESTRICTIONS: No  FALLS:  Has patient fallen in last 6 months? 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 Right subarticular disc protrusion at L5-S1, contacting and minimally displacing the descending right S1 nerve root in the right lateral recess. 2. Small right foraminal disc protrusion at L3-4, closely approximating and potentially irritating the exiting right L3 nerve root. 3. Small  central to left subarticular disc protrusion at L4-5 with resultant mild spinal stenosis.  PATIENT SURVEYS:  Modified Oswestry 22/50; 44% perceived disability   SCREENING FOR RED FLAGS: Bowel or bladder incontinence: No Spinal tumors: No Cauda equina syndrome: No Compression fracture: No Abdominal aneurysm: No  COGNITION: Overall cognitive status: Within functional limits for tasks assessed     POSTURE: anterior pelvic tilt  PALPATION: Palpable taut bands of Rt piriformis and Rt erector spinae  LUMBAR ROM:   AROM eval  Flexion WFL  Extension 60% limited  Right lateral flexion Above knee joint line  Left lateral flexion Bend to knee joint line  Right rotation WFL  Left rotation 60% limited   (Blank rows = not tested)   LOWER EXTREMITY MMT:    MMT Right eval Left eval  Hip flexion 3 4-  Hip extension    Hip abduction 4- 4  Hip adduction    Hip internal rotation    Hip external rotation    Knee flexion 4 pain 4  Knee extension 4 pain 4+  Ankle dorsiflexion    Ankle plantarflexion    Ankle inversion    Ankle eversion     (Blank rows = not tested)  LUMBAR SPECIAL TESTS:  Slump test: Negative  FUNCTIONAL TESTS:  5 times sit to stand: 29.43 UE on legs Timed up and go (TUG): 14.96  GAIT: Comments: Antalgic gait pattern; Decreased Lt LE step length  TODAY'S TREATMENT:  DATE:  10/11/2023 NuStep level 4 5 minutes- PT present to discuss status Prone Press Ups x 10 Prone hip extension knee flexed  x 10 each Prone hip ER/IR with knee flexed x 10 3 way SB stretch x 8 each way Peanut ball stretch x 10 each side 5 sec hold Seated TA contraction + Hip flexion x 10 Seated alt hand and knee press x 10 Manual: Addaday to lumbar spine to increase blood flow to area. Patient prone and PT present to assess response.   10/09/2023 Reviewed HEP  exercises Prone Press Ups x 8 Prone hip extension knee flexed x 10 each Prone hip ER/IR with knee flexed x 10 Seated hamstring stretch 2 x 30 sec Seated QL stretch 3 x 10 sec  3 way SB stretch x 8 each way Seated PPT x 10 Seated PPT + Hip flexion x 8 Manual: Addaday lumbar spine   09/16/23: HEP established   Manual STM to Rt erector spinae & Rt piriformis  If treatment provided at initial evaluation, no treatment charged due to lack of authorization.       PATIENT EDUCATION:  Education details: hip mobility; POC Person educated: Patient Education method: Explanation, Demonstration, and Handouts Education comprehension: verbalized understanding, returned demonstration, and needs further education  HOME EXERCISE PROGRAM: Access Code: [email or web address] URL: [email or web address] Date: 09/16/2023 Prepared by: Claude Manges  Exercises - Standing 'L' Stretch at Counter  - 1 x daily - 7 x weekly - 3 sets - 8 reps - Seated Piriformis Stretch with Trunk Bend  - 1 x daily - 7 x weekly - 3 sets - 8 reps - 20 hold - Side Lunge with Counter Support  - 1 x daily - 7 x weekly - 3 sets - 8 reps - 20 hold - Seated Hamstring Stretch  - 1 x daily - 7 x weekly - 3 sets - 8 reps - 20 hold  ASSESSMENT:  CLINICAL IMPRESSION: Keryn presents to therapy a lot less painful compared to last treatment session. She verbalized feeling a lot better when she went home after session. Updating patient's goals and she has met all her short term goals at this time. She reports a 80% improvement in her walking tolerance without having increased back pain. Patient tolerated treatment session well and required moderate verbal cues for proper exercise performance. Patient will benefit from skilled PT to address the below impairments and improve overall function.     OBJECTIVE IMPAIRMENTS: Abnormal gait, decreased mobility, difficulty walking, decreased ROM, decreased strength, increased muscle spasms, and  impaired flexibility.   ACTIVITY LIMITATIONS: carrying, bending, sitting, standing, sleeping, and stairs  PARTICIPATION LIMITATIONS: driving, shopping, and community activity  PERSONAL FACTORS: Fitness are also affecting patient's functional outcome.   REHAB POTENTIAL: Good  CLINICAL DECISION MAKING: Stable/uncomplicated  EVALUATION COMPLEXITY: Low   GOALS: Goals reviewed with patient? Yes  SHORT TERM GOALS: Target date: 10/14/2023 Patient will be independent with initial HEP. Baseline:  Goal status: MET 10/11/2023  2.  Patient will report > or = to 25% improvement in walking tolerance. Baseline: 5 minutes Goal status: MET 10/11/2023 Patient reports 80% improvement  3. Patient will be able to stand for > or = to 15 minutes without increase back and Rt LE pain. Baseline:10 minutes Goal status: MET 10/11/2023   LONG TERM GOALS: Target date: 11/11/2023 Patient will demonstrate independence in advanced HEP. Baseline:  Goal status: INITIAL  2.  Patient will report > or = to 50% improvement in walking tolerance. Baseline: 5  minutes Goal status: INITIAL  3. Patient Oswestry Score will be < or = to 12 for decreased perceived disability. Baseline: 22/50 Goal status: INITIAL  4.Patient will be able to stand for > or = to 20 minutes without increase back and Rt LE pain. Baseline: 10 minutes Goal status: INITIAL   PLAN:  PT FREQUENCY: 2x/week  PT DURATION: 8 weeks  PLANNED INTERVENTIONS: Therapeutic exercises, Therapeutic activity, Neuromuscular re-education, Balance training, Gait training, Patient/Family education, Self Care, Joint mobilization, Joint manipulation, Stair training, Vestibular training, Canalith repositioning, Aquatic Therapy, Dry Needling, Electrical stimulation, Spinal manipulation, Spinal mobilization, Cryotherapy, Moist heat, Compression bandaging, scar mobilization, Splintting, Taping, Vasopneumatic device, Traction, Ultrasound, Biofeedback,  Ionotophoresis 4mg /ml Dexamethasone, and Manual therapy.  PLAN FOR NEXT SESSION: standing hip strengthening;  progress core exercises; update HEP to include core exercises   Claude Manges, PT 10/11/23 9:31 AM  [facility] [address], [address] [place], [place] [address] Phone # [phone] Fax [phone]

## 2023-10-11 NOTE — Telephone Encounter (Signed)
Called pt and advised. Pt verbalized understanding.  

## 2023-10-15 ENCOUNTER — Ambulatory Visit: Payer: Medicaid Other | Admitting: Physical Therapy

## 2023-10-15 ENCOUNTER — Encounter: Payer: Self-pay | Admitting: Physical Therapy

## 2023-10-15 DIAGNOSIS — M62830 Muscle spasm of back: Secondary | ICD-10-CM

## 2023-10-15 DIAGNOSIS — M25551 Pain in right hip: Secondary | ICD-10-CM

## 2023-10-15 DIAGNOSIS — M62838 Other muscle spasm: Secondary | ICD-10-CM | POA: Diagnosis not present

## 2023-10-15 DIAGNOSIS — R293 Abnormal posture: Secondary | ICD-10-CM

## 2023-10-15 NOTE — Therapy (Signed)
OUTPATIENT PHYSICAL THERAPY THORACOLUMBAR TREATMENT   Patient Name: Renee [name] MRN: [id number] DOB:10/26/1976, 47 y.o., female Today's Date: 10/15/2023  END OF SESSION:  PT End of Session - 10/15/23 0930     Visit Number 4    Date for PT Re-Evaluation 11/11/23    Authorization Type [place] MEDICAID- [employer]; Berkley Harvey req at 27 visits    PT Start Time [phone]   Patient was late to appt   PT Stop Time 0930    PT Time Calculation (min) 37 min    Activity Tolerance Patient tolerated treatment well    Behavior During Therapy [facility] for tasks assessed/performed                Past Medical History:  Diagnosis Date   Anemia    No pertinent past medical history    Pelvic pain in female    Past Surgical History:  Procedure Laterality Date   BILATERAL SALPINGECTOMY N/A 08/04/2015   Procedure: LEFT SALPINGECTOMY;  Surgeon: Mitchel Honour, DO;  Location: [facility];  Service: Gynecology;  Laterality: N/A;   CESAREAN SECTION     ENDOMETRIAL ABLATION     LAPAROSCOPIC ASSISTED VAGINAL HYSTERECTOMY N/A 08/04/2015   Procedure: LAPAROSCOPIC ASSISTED VAGINAL HYSTERECTOMY;  Surgeon: Mitchel Honour, DO;  Location: [facility];  Service: Gynecology;  Laterality: N/A;   LAPAROSCOPIC LYSIS OF ADHESIONS N/A 08/04/2015   Procedure: LAPAROSCOPIC LYSIS OF ADHESIONS;  Surgeon: Mitchel Honour, DO;  Location: [facility];  Service: Gynecology;  Laterality: N/A;   TUBAL LIGATION     WISDOM TOOTH EXTRACTION     Patient Active Problem List   Diagnosis Date Noted   Obesity, morbid, BMI 50 or higher (HCC) 08/15/2020   Mild sleep apnea 01/19/2020   S/P hysterectomy 08/04/2015   Pelvic pain in female 11/17/2012    REFERRING PROVIDER: Rodolph Bong, MDCorey, Michel Harrow, MD  REFERRING DIAG: [phone] (ICD-10-CM) - Lumbar radiculitis M25.551 (ICD-10-CM) - Pain of right hip  Rationale for Evaluation and Treatment: Rehabilitation  THERAPY DIAG:  Other muscle spasm  Muscle spasm of back  Pain in right hip  Abnormal  posture  ONSET DATE: 09/07/23  SUBJECTIVE:                                                                                                                                                                                           SUBJECTIVE STATEMENT: Patient report she is doing okay pain. She felt great after last treatment session. He pain is currently 4/10.  From eval: Patient present to therapy with Rt sided lumbar back pain that started two weeks ago. She woke up with numbness in  her Rt hip flexor area and shooting pain into her quad. Patient increased her activity 2 weeks before this occurred ; she was walking 1.5 miles/day. She notices her leg will give away when she stands up after sitting for a long time. She has burning in her lateral Rt hip and numbness hip flexor area occasionally . Patient has not been able to sleep at night; she is normally a side sleeper. While working patient has to take seated rest breaks due to pain.  PERTINENT HISTORY:  None  PAIN: 10/15/2023 Are you having pain? Yes: NPRS scale: 4/10 Pain location: Rt lower lumbar area; Rt hip flexor crease Pain description: Achy; sharp; shooting; dull Aggravating factors: Laying down in any position; driving; standing > 30-86VHQI; Bending Relieving factors: Medication, massage gun   PRECAUTIONS: None  RED FLAGS: None   WEIGHT BEARING RESTRICTIONS: No  FALLS:  Has patient fallen in last 6 months? 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 Right subarticular disc protrusion at L5-S1, contacting and minimally displacing the descending right S1 nerve root in the right lateral recess. 2. Small right foraminal disc protrusion at L3-4, closely approximating and potentially irritating the exiting right L3 nerve root. 3.  Small central to left subarticular disc protrusion at L4-5 with resultant mild spinal stenosis.  PATIENT SURVEYS:  Modified Oswestry 22/50; 44% perceived disability   SCREENING FOR RED FLAGS: Bowel or bladder incontinence: No Spinal tumors: No Cauda equina syndrome: No Compression fracture: No Abdominal aneurysm: No  COGNITION: Overall cognitive status: Within functional limits for tasks assessed     POSTURE: anterior pelvic tilt  PALPATION: Palpable taut bands of Rt piriformis and Rt erector spinae  LUMBAR ROM:   AROM eval  Flexion WFL  Extension 60% limited  Right lateral flexion Above knee joint line  Left lateral flexion Bend to knee joint line  Right rotation WFL  Left rotation 60% limited   (Blank rows = not tested)   LOWER EXTREMITY MMT:    MMT Right eval Left eval  Hip flexion 3 4-  Hip extension    Hip abduction 4- 4  Hip adduction    Hip internal rotation    Hip external rotation    Knee flexion 4 pain 4  Knee extension 4 pain 4+  Ankle dorsiflexion    Ankle plantarflexion    Ankle inversion    Ankle eversion     (Blank rows = not tested)  LUMBAR SPECIAL TESTS:  Slump test: Negative  FUNCTIONAL TESTS:  5 times sit to stand: 29.43 UE on legs Timed up and go (TUG): 14.96  GAIT: Comments: Antalgic gait pattern; Decreased Lt LE step length  TODAY'S TREATMENT:  DATE:  10/15/2023 NuStep level 4 5 minutes- PT present to discuss status 3 way SB stretch x 8 each way Supine LTR x 10 each Supine Posterior Pelvic Tilt x 10 Supine Hip flexion + TA contraction - stopped patient experienced increased back pain Supine TA contraction + Bent knee fall out x 10 each Supine hamstring stretch with green strap 2 x 20-30 sec each leg Supine adductor stretch with green strap 2 x 20-30 sec each leg Standing march with 5lb KB 2 sec hold  x 10 each leg  Pallof Press red loop 2 x 10 each way   10/11/2023 NuStep level 4 5 minutes- PT present to discuss status Prone Press Ups x 10 Prone hip extension knee flexed  x 10 each Prone hip ER/IR with knee flexed x 10 3 way SB stretch x 8 each way Peanut ball stretch x 10 each side 5 sec hold Seated TA contraction + Hip flexion x 10 Seated alt hand and knee press x 10 Manual: Addaday to lumbar spine to increase blood flow to area. Patient prone and PT present to assess response.   10/09/2023 Reviewed HEP exercises Prone Press Ups x 8 Prone hip extension knee flexed x 10 each Prone hip ER/IR with knee flexed x 10 Seated hamstring stretch 2 x 30 sec Seated QL stretch 3 x 10 sec  3 way SB stretch x 8 each way Seated PPT x 10 Seated PPT + Hip flexion x 8 Manual: Addaday lumbar spine      PATIENT EDUCATION:  Education details: hip mobility; POC Person educated: Patient Education method: Explanation, Demonstration, and Handouts Education comprehension: verbalized understanding, returned demonstration, and needs further education  HOME EXERCISE PROGRAM: Access Code: [email or web address] URL: [email or web address] Date: 10/15/2023 Prepared by: Claude Manges  Exercises - Standing 'L' Stretch at Counter  - 1 x daily - 7 x weekly - 3 sets - 8 reps - Seated Piriformis Stretch with Trunk Bend  - 1 x daily - 7 x weekly - 3 sets - 8 reps - 20 hold - Side Lunge with Counter Support  - 1 x daily - 7 x weekly - 3 sets - 8 reps - 20 hold - Seated Hamstring Stretch  - 1 x daily - 7 x weekly - 3 sets - 8 reps - 20 hold - Supine Lower Trunk Rotation  - 1 x daily - 7 x weekly - 1 sets - 10 reps - 1-2 hold - Supine Posterior Pelvic Tilt  - 1 x daily - 7 x weekly - 2 sets - 10 reps - 4 hold  ASSESSMENT:  CLINICAL IMPRESSION: Coline continues to verbalize improvements in function. Sitting is her most limiting activity currently. Her back instantly starts hurting once sitting down, so  driving is very limiting for her. Today's treatment session focused on lumbar mobility and core strengthening. Patient tolerated treatment session well. Supine TA contraction + hip flexion was challenging and caused increased back pain; opted to perform bent knee fall outs instead. Updated patient's HEP to include lower trunk rotation and hooklying posterior pelvic tilt. Patient will benefit from skilled PT to address the below impairments and improve overall function.      OBJECTIVE IMPAIRMENTS: Abnormal gait, decreased mobility, difficulty walking, decreased ROM, decreased strength, increased muscle spasms, and impaired flexibility.   ACTIVITY LIMITATIONS: carrying, bending, sitting, standing, sleeping, and stairs  PARTICIPATION LIMITATIONS: driving, shopping, and community activity  PERSONAL FACTORS: Fitness are also affecting patient's functional outcome.   REHAB POTENTIAL: Good  CLINICAL DECISION MAKING: Stable/uncomplicated  EVALUATION COMPLEXITY: Low   GOALS: Goals reviewed with patient? Yes  SHORT TERM GOALS: Target date: 10/14/2023 Patient will be independent with initial HEP. Baseline:  Goal status: MET 10/11/2023  2.  Patient will report > or = to 25% improvement in walking tolerance. Baseline: 5 minutes Goal status: MET 10/11/2023 Patient reports 80% improvement  3. Patient will be able to stand for > or = to 15 minutes without increase back and Rt LE pain. Baseline:10 minutes Goal status: MET 10/11/2023   LONG TERM GOALS: Target date: 11/11/2023 Patient will demonstrate independence in advanced HEP. Baseline:  Goal status: INITIAL  2.  Patient will report > or = to 50% improvement in walking tolerance. Baseline: 5 minutes Goal status: INITIAL  3. Patient Oswestry Score will be < or = to 12 for decreased perceived disability. Baseline: 22/50 Goal status: INITIAL  4.Patient will be able to stand for > or = to 20 minutes without increase back and Rt LE  pain. Baseline: 10 minutes Goal status: INITIAL   PLAN:  PT FREQUENCY: 2x/week  PT DURATION: 8 weeks  PLANNED INTERVENTIONS: Therapeutic exercises, Therapeutic activity, Neuromuscular re-education, Balance training, Gait training, Patient/Family education, Self Care, Joint mobilization, Joint manipulation, Stair training, Vestibular training, Canalith repositioning, Aquatic Therapy, Dry Needling, Electrical stimulation, Spinal manipulation, Spinal mobilization, Cryotherapy, Moist heat, Compression bandaging, scar mobilization, Splintting, Taping, Vasopneumatic device, Traction, Ultrasound, Biofeedback, Ionotophoresis 4mg /ml Dexamethasone, and Manual therapy.  PLAN FOR NEXT SESSION: hip matrix & clamshells   Claude Manges, PT 10/15/23 9:31 AM  [facility] [address], [address] [place], [place] [address] Phone # [phone] Fax [phone]

## 2023-10-18 ENCOUNTER — Encounter: Payer: Self-pay | Admitting: Physical Therapy

## 2023-10-18 ENCOUNTER — Ambulatory Visit: Payer: Medicaid Other | Admitting: Physical Therapy

## 2023-10-18 DIAGNOSIS — R293 Abnormal posture: Secondary | ICD-10-CM

## 2023-10-18 DIAGNOSIS — M25551 Pain in right hip: Secondary | ICD-10-CM

## 2023-10-18 DIAGNOSIS — M62838 Other muscle spasm: Secondary | ICD-10-CM

## 2023-10-18 DIAGNOSIS — M62830 Muscle spasm of back: Secondary | ICD-10-CM

## 2023-10-18 NOTE — Therapy (Signed)
OUTPATIENT PHYSICAL THERAPY THORACOLUMBAR TREATMENT   Patient Name: Renee [name] MRN: [id number] DOB:November 14, 1976, 47 y.o., female Today's Date: 10/18/2023  END OF SESSION:  PT End of Session - 10/18/23 0929     Visit Number 5    Date for PT Re-Evaluation 11/11/23    Authorization Type [place] MEDICAID- [facility]; Berkley Harvey req at 27 visits    PT Start Time 0847    PT Stop Time 0927    PT Time Calculation (min) 40 min    Activity Tolerance Patient tolerated treatment well    Behavior During Therapy [facility] for tasks assessed/performed                 Past Medical History:  Diagnosis Date   Anemia    No pertinent past medical history    Pelvic pain in female    Past Surgical History:  Procedure Laterality Date   BILATERAL SALPINGECTOMY N/A 08/04/2015   Procedure: LEFT SALPINGECTOMY;  Surgeon: Mitchel Honour, DO;  Location: [facility];  Service: Gynecology;  Laterality: N/A;   CESAREAN SECTION     ENDOMETRIAL ABLATION     LAPAROSCOPIC ASSISTED VAGINAL HYSTERECTOMY N/A 08/04/2015   Procedure: LAPAROSCOPIC ASSISTED VAGINAL HYSTERECTOMY;  Surgeon: Mitchel Honour, DO;  Location: [facility];  Service: Gynecology;  Laterality: N/A;   LAPAROSCOPIC LYSIS OF ADHESIONS N/A 08/04/2015   Procedure: LAPAROSCOPIC LYSIS OF ADHESIONS;  Surgeon: Mitchel Honour, DO;  Location: [facility];  Service: Gynecology;  Laterality: N/A;   TUBAL LIGATION     WISDOM TOOTH EXTRACTION     Patient Active Problem List   Diagnosis Date Noted   Obesity, morbid, BMI 50 or higher (HCC) 08/15/2020   Mild sleep apnea 01/19/2020   S/P hysterectomy 08/04/2015   Pelvic pain in female 11/17/2012    REFERRING PROVIDER: Rodolph Bong, MDCorey, Michel Harrow, MD  REFERRING DIAG: [phone] (ICD-10-CM) - Lumbar radiculitis M25.551 (ICD-10-CM) - Pain of right hip  Rationale for Evaluation and Treatment: Rehabilitation  THERAPY DIAG:  Other muscle spasm  Muscle spasm of back  Pain in right hip  Abnormal posture  ONSET DATE:  09/07/23  SUBJECTIVE:                                                                                                                                                                                           SUBJECTIVE STATEMENT: Patient reports she is doing okay today. She felt good after last treatment session. Currently her pain is 3/10.  From eval: Patient present to therapy with Rt sided lumbar back pain that started two weeks ago. She woke up with numbness in her Rt hip flexor area  and shooting pain into her quad. Patient increased her activity 2 weeks before this occurred ; she was walking 1.5 miles/day. She notices her leg will give away when she stands up after sitting for a long time. She has burning in her lateral Rt hip and numbness hip flexor area occasionally . Patient has not been able to sleep at night; she is normally a side sleeper. While working patient has to take seated rest breaks due to pain.  PERTINENT HISTORY:  None  PAIN: 10/18/2023 Are you having pain? Yes: NPRS scale: 3/10 Pain location: Rt lower lumbar area; Rt hip flexor crease Pain description: Achy; sharp; shooting; dull Aggravating factors: Laying down in any position; driving; standing > [id number]; Bending Relieving factors: Medication, massage gun   PRECAUTIONS: None  RED FLAGS: None   WEIGHT BEARING RESTRICTIONS: No  FALLS:  Has patient fallen in last 6 months? 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 Right subarticular disc protrusion at L5-S1, contacting and minimally displacing the descending right S1 nerve root in the right lateral recess. 2. Small right foraminal disc protrusion at L3-4, closely approximating and potentially irritating the exiting right L3 nerve root. 3. Small central to left  subarticular disc protrusion at L4-5 with resultant mild spinal stenosis.  PATIENT SURVEYS:  Modified Oswestry 22/50; 44% perceived disability   SCREENING FOR RED FLAGS: Bowel or bladder incontinence: No Spinal tumors: No Cauda equina syndrome: No Compression fracture: No Abdominal aneurysm: No  COGNITION: Overall cognitive status: Within functional limits for tasks assessed     POSTURE: anterior pelvic tilt  PALPATION: Palpable taut bands of Rt piriformis and Rt erector spinae  LUMBAR ROM:   AROM eval  Flexion WFL  Extension 60% limited  Right lateral flexion Above knee joint line  Left lateral flexion Bend to knee joint line  Right rotation WFL  Left rotation 60% limited   (Blank rows = not tested)   LOWER EXTREMITY MMT:    MMT Right eval Left eval  Hip flexion 3 4-  Hip extension    Hip abduction 4- 4  Hip adduction    Hip internal rotation    Hip external rotation    Knee flexion 4 pain 4  Knee extension 4 pain 4+  Ankle dorsiflexion    Ankle plantarflexion    Ankle inversion    Ankle eversion     (Blank rows = not tested)  LUMBAR SPECIAL TESTS:  Slump test: Negative  FUNCTIONAL TESTS:  5 times sit to stand: 29.43 UE on legs Timed up and go (TUG): 14.96  GAIT: Comments: Antalgic gait pattern; Decreased Lt LE step length  TODAY'S TREATMENT:  DATE:  10/18/2023 3 way SB stretch x 8 each way Supine hamstring stretch with green strap 2 x 20-30 sec each leg Supine adductor stretch with green strap 2 x 20-30 sec each leg Supine LTR x 10 each Supine TA contraction + Bent knee fall out x 10 each SL clam shells  x 10 each Seated hip flexion + TA contraction X 10 each leg Standing march with 5lb KB 2 sec hold x 10 each leg  Lateral trunk flexion 5# KB x 10 each side Unilateral Farmer's carries 10# KB x 3 Pallof Press red loop  2 x 10 each way Seated Hinging x 10 Standing hinging x 10  Picking up an object x 2  10/15/2023 NuStep level 4 5 minutes- PT present to discuss status 3 way SB stretch x 8 each way Supine LTR x 10 each Supine Posterior Pelvic Tilt x 10 Supine Hip flexion + TA contraction - stopped patient experienced increased back pain Supine TA contraction + Bent knee fall out x 10 each Supine hamstring stretch with green strap 2 x 20-30 sec each leg Supine adductor stretch with green strap 2 x 20-30 sec each leg Standing march with 5lb KB 2 sec hold x 10 each leg  Pallof Press red loop 2 x 10 each way   10/11/2023 NuStep level 4 5 minutes- PT present to discuss status Prone Press Ups x 10 Prone hip extension knee flexed  x 10 each Prone hip ER/IR with knee flexed x 10 3 way SB stretch x 8 each way Peanut ball stretch x 10 each side 5 sec hold Seated TA contraction + Hip flexion x 10 Seated alt hand and knee press x 10 Manual: Addaday to lumbar spine to increase blood flow to area. Patient prone and PT present to assess response      PATIENT EDUCATION:  Education details: hip mobility; POC Person educated: Patient Education method: Explanation, Demonstration, and Handouts Education comprehension: verbalized understanding, returned demonstration, and needs further education  HOME EXERCISE PROGRAM: Access Code: [email or web address] URL: [email or web address] Date: 10/15/2023 Prepared by: Claude Manges  Exercises - Standing 'L' Stretch at Counter  - 1 x daily - 7 x weekly - 3 sets - 8 reps - Seated Piriformis Stretch with Trunk Bend  - 1 x daily - 7 x weekly - 3 sets - 8 reps - 20 hold - Side Lunge with Counter Support  - 1 x daily - 7 x weekly - 3 sets - 8 reps - 20 hold - Seated Hamstring Stretch  - 1 x daily - 7 x weekly - 3 sets - 8 reps - 20 hold - Supine Lower Trunk Rotation  - 1 x daily - 7 x weekly - 1 sets - 10 reps - 1-2 hold - Supine Posterior Pelvic Tilt  - 1 x daily - 7 x  weekly - 2 sets - 10 reps - 4 hold  ASSESSMENT:  CLINICAL IMPRESSION: Today's treatment session focused on hip mobility and lumbar strengthening. Patient went back to work this week and she was able to tolerate standing for 30 minutes without having to sit down. She was not in increased pain later in the day. Patient tolerated treatment session well and did not verbalize any increased pain. Required minimal verbal cues for form correction. Side lying clamshells were more challenging on Rt compared to Lt due to weakness. Patient will benefit from skilled PT to address the below impairments and improve overall function.      OBJECTIVE  IMPAIRMENTS: Abnormal gait, decreased mobility, difficulty walking, decreased ROM, decreased strength, increased muscle spasms, and impaired flexibility.   ACTIVITY LIMITATIONS: carrying, bending, sitting, standing, sleeping, and stairs  PARTICIPATION LIMITATIONS: driving, shopping, and community activity  PERSONAL FACTORS: Fitness are also affecting patient's functional outcome.   REHAB POTENTIAL: Good  CLINICAL DECISION MAKING: Stable/uncomplicated  EVALUATION COMPLEXITY: Low   GOALS: Goals reviewed with patient? Yes  SHORT TERM GOALS: Target date: 10/14/2023 Patient will be independent with initial HEP. Baseline:  Goal status: MET 10/11/2023  2.  Patient will report > or = to 25% improvement in walking tolerance. Baseline: 5 minutes Goal status: MET 10/11/2023 Patient reports 80% improvement  3. Patient will be able to stand for > or = to 15 minutes without increase back and Rt LE pain. Baseline:10 minutes Goal status: MET 10/11/2023   LONG TERM GOALS: Target date: 11/11/2023 Patient will demonstrate independence in advanced HEP. Baseline:  Goal status: INITIAL  2.  Patient will report > or = to 50% improvement in walking tolerance. Baseline: 5 minutes Goal status: INITIAL  3. Patient Oswestry Score will be < or = to 12 for decreased  perceived disability. Baseline: 22/50 Goal status: INITIAL  4.Patient will be able to stand for > or = to 20 minutes without increase back and Rt LE pain. Baseline: 10 minutes Goal status: met 10/18/2023   PLAN:  PT FREQUENCY: 2x/week  PT DURATION: 8 weeks  PLANNED INTERVENTIONS: Therapeutic exercises, Therapeutic activity, Neuromuscular re-education, Balance training, Gait training, Patient/Family education, Self Care, Joint mobilization, Joint manipulation, Stair training, Vestibular training, Canalith repositioning, Aquatic Therapy, Dry Needling, Electrical stimulation, Spinal manipulation, Spinal mobilization, Cryotherapy, Moist heat, Compression bandaging, scar mobilization, Splintting, Taping, Vasopneumatic device, Traction, Ultrasound, Biofeedback, Ionotophoresis 4mg /ml Dexamethasone, and Manual therapy.  PLAN FOR NEXT SESSION: standing hinging; standing hip series   Claude Manges, PT 10/18/23 9:30 AM  [facility] [address] [place], [place] [address] Phone # [phone] Fax [phone]

## 2023-10-22 ENCOUNTER — Ambulatory Visit: Payer: Medicaid Other | Admitting: Physical Therapy

## 2023-10-25 ENCOUNTER — Ambulatory Visit: Payer: Medicaid Other | Attending: Family Medicine | Admitting: Physical Therapy

## 2023-10-25 ENCOUNTER — Encounter: Payer: Self-pay | Admitting: Physical Therapy

## 2023-10-25 DIAGNOSIS — R262 Difficulty in walking, not elsewhere classified: Secondary | ICD-10-CM | POA: Diagnosis present

## 2023-10-25 DIAGNOSIS — M62830 Muscle spasm of back: Secondary | ICD-10-CM | POA: Diagnosis present

## 2023-10-25 DIAGNOSIS — M5459 Other low back pain: Secondary | ICD-10-CM | POA: Diagnosis present

## 2023-10-25 DIAGNOSIS — M25551 Pain in right hip: Secondary | ICD-10-CM | POA: Insufficient documentation

## 2023-10-25 DIAGNOSIS — M6281 Muscle weakness (generalized): Secondary | ICD-10-CM | POA: Insufficient documentation

## 2023-10-25 DIAGNOSIS — R293 Abnormal posture: Secondary | ICD-10-CM | POA: Diagnosis present

## 2023-10-25 DIAGNOSIS — M62838 Other muscle spasm: Secondary | ICD-10-CM | POA: Insufficient documentation

## 2023-10-25 DIAGNOSIS — R252 Cramp and spasm: Secondary | ICD-10-CM | POA: Insufficient documentation

## 2023-10-25 NOTE — Therapy (Signed)
OUTPATIENT PHYSICAL THERAPY THORACOLUMBAR TREATMENT   Patient Name: Renee [name] MRN: [id number] DOB:07-08-1976, 47 y.o., female Today's Date: 10/25/2023  END OF SESSION:  PT End of Session - 10/25/23 0932     Visit Number 6    Date for PT Re-Evaluation 11/11/23    Authorization Type [place] MEDICAID- [facility]; Berkley Harvey req at 27 visits    PT Start Time 0850    PT Stop Time 0929    PT Time Calculation (min) 39 min    Activity Tolerance Patient tolerated treatment well    Behavior During Therapy [facility] for tasks assessed/performed                  Past Medical History:  Diagnosis Date   Anemia    No pertinent past medical history    Pelvic pain in female    Past Surgical History:  Procedure Laterality Date   BILATERAL SALPINGECTOMY N/A 08/04/2015   Procedure: LEFT SALPINGECTOMY;  Surgeon: Mitchel Honour, DO;  Location: [facility];  Service: Gynecology;  Laterality: N/A;   CESAREAN SECTION     ENDOMETRIAL ABLATION     LAPAROSCOPIC ASSISTED VAGINAL HYSTERECTOMY N/A 08/04/2015   Procedure: LAPAROSCOPIC ASSISTED VAGINAL HYSTERECTOMY;  Surgeon: Mitchel Honour, DO;  Location: [facility];  Service: Gynecology;  Laterality: N/A;   LAPAROSCOPIC LYSIS OF ADHESIONS N/A 08/04/2015   Procedure: LAPAROSCOPIC LYSIS OF ADHESIONS;  Surgeon: Mitchel Honour, DO;  Location: [facility];  Service: Gynecology;  Laterality: N/A;   TUBAL LIGATION     WISDOM TOOTH EXTRACTION     Patient Active Problem List   Diagnosis Date Noted   Obesity, morbid, BMI 50 or higher (HCC) 08/15/2020   Mild sleep apnea 01/19/2020   S/P hysterectomy 08/04/2015   Pelvic pain in female 11/17/2012    REFERRING PROVIDER: Rodolph Bong, MDCorey, Michel Harrow, MD  REFERRING DIAG: [phone] (ICD-10-CM) - Lumbar radiculitis M25.551 (ICD-10-CM) - Pain of right hip  Rationale for Evaluation and Treatment: Rehabilitation  THERAPY DIAG:  Other low back pain  Other muscle spasm  Muscle spasm of back  Pain in right hip  Abnormal  posture  ONSET DATE: 09/07/23  SUBJECTIVE:                                                                                                                                                                                           SUBJECTIVE STATEMENT: Patient reports she is doing okay today. Her back has been a little more painful this week. Currently her back pain is 3/10.  From eval: Patient present to therapy with Rt sided lumbar back pain that started two weeks ago. She  woke up with numbness in her Rt hip flexor area and shooting pain into her quad. Patient increased her activity 2 weeks before this occurred ; she was walking 1.5 miles/day. She notices her leg will give away when she stands up after sitting for a long time. She has burning in her lateral Rt hip and numbness hip flexor area occasionally . Patient has not been able to sleep at night; she is normally a side sleeper. While working patient has to take seated rest breaks due to pain.  PERTINENT HISTORY:  None  PAIN: 10/25/2023 Are you having pain? Yes: NPRS scale: 3/10 Pain location: Rt lower lumbar area; Rt hip flexor crease Pain description: Achy; sharp; shooting; dull Aggravating factors: Laying down in any position; driving; standing > 70-62BJSE; Bending Relieving factors: Medication, massage gun   PRECAUTIONS: None  RED FLAGS: None   WEIGHT BEARING RESTRICTIONS: No  FALLS:  Has patient fallen in last 6 months? 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 Right subarticular disc protrusion at L5-S1, contacting and minimally displacing the descending right S1 nerve root in the right lateral recess. 2. Small right foraminal disc protrusion at L3-4, closely approximating and potentially irritating the exiting right L3  nerve root. 3. Small central to left subarticular disc protrusion at L4-5 with resultant mild spinal stenosis.  PATIENT SURVEYS:  Modified Oswestry 22/50; 44% perceived disability   SCREENING FOR RED FLAGS: Bowel or bladder incontinence: No Spinal tumors: No Cauda equina syndrome: No Compression fracture: No Abdominal aneurysm: No  COGNITION: Overall cognitive status: Within functional limits for tasks assessed     POSTURE: anterior pelvic tilt  PALPATION: Palpable taut bands of Rt piriformis and Rt erector spinae  LUMBAR ROM:   AROM eval  Flexion WFL  Extension 60% limited  Right lateral flexion Above knee joint line  Left lateral flexion Bend to knee joint line  Right rotation WFL  Left rotation 60% limited   (Blank rows = not tested)   LOWER EXTREMITY MMT:    MMT Right eval Left eval  Hip flexion 3 4-  Hip extension    Hip abduction 4- 4  Hip adduction    Hip internal rotation    Hip external rotation    Knee flexion 4 pain 4  Knee extension 4 pain 4+  Ankle dorsiflexion    Ankle plantarflexion    Ankle inversion    Ankle eversion     (Blank rows = not tested)  LUMBAR SPECIAL TESTS:  Slump test: Negative  FUNCTIONAL TESTS:  5 times sit to stand: 29.43 UE on legs Timed up and go (TUG): 14.96  GAIT: Comments: Antalgic gait pattern; Decreased Lt LE step length  TODAY'S TREATMENT:  DATE:  10/25/2023 NuStep level 4 5 minutes- PT present to discuss status 3 way SB stretch x 8 each way Supine hamstring stretch with green strap 2 x 20-30 sec each leg Supine adductor stretch with green strap 2 x 20-30 sec each leg Supine LTR x 10 each Supine TA contraction + Bent knee fall out x 10 each Supine TA contraction + Hip flexion x 10 each Lateral trunk flexion 5# KB 2 x 10 each side Unilateral Farmer's carries 10# KB x 4 Pallof  Press red loop 2 x 10 each way Manual therapy to lumbar muscles for improved circulation. PT present to monitor patient's response.   10/18/2023 3 way SB stretch x 8 each way Supine hamstring stretch with green strap 2 x 20-30 sec each leg Supine adductor stretch with green strap 2 x 20-30 sec each leg Supine LTR x 10 each Supine TA contraction + Bent knee fall out x 10 each SL clam shells  x 10 each Seated hip flexion + TA contraction X 10 each leg Standing march with 5lb KB 2 sec hold x 10 each leg  Lateral trunk flexion 5# KB x 10 each side Unilateral Farmer's carries 10# KB x 3 Pallof Press red loop 2 x 10 each way Seated Hinging x 10 Standing hinging x 10  Picking up an object x 2  10/15/2023 NuStep level 4 5 minutes- PT present to discuss status 3 way SB stretch x 8 each way Supine LTR x 10 each Supine Posterior Pelvic Tilt x 10 Supine Hip flexion + TA contraction - stopped patient experienced increased back pain Supine TA contraction + Bent knee fall out x 10 each Supine hamstring stretch with green strap 2 x 20-30 sec each leg Supine adductor stretch with green strap 2 x 20-30 sec each leg Standing march with 5lb KB 2 sec hold x 10 each leg  Pallof Press red loop 2 x 10 each way    PATIENT EDUCATION:  Education details: hip mobility; POC Person educated: Patient Education method: Explanation, Demonstration, and Handouts Education comprehension: verbalized understanding, returned demonstration, and needs further education  HOME EXERCISE PROGRAM: Access Code: [email or web address] URL: [email or web address] Date: 10/15/2023 Prepared by: Claude Manges  Exercises - Standing 'L' Stretch at Counter  - 1 x daily - 7 x weekly - 3 sets - 8 reps - Seated Piriformis Stretch with Trunk Bend  - 1 x daily - 7 x weekly - 3 sets - 8 reps - 20 hold - Side Lunge with Counter Support  - 1 x daily - 7 x weekly - 3 sets - 8 reps - 20 hold - Seated Hamstring Stretch  - 1 x daily - 7  x weekly - 3 sets - 8 reps - 20 hold - Supine Lower Trunk Rotation  - 1 x daily - 7 x weekly - 1 sets - 10 reps - 1-2 hold - Supine Posterior Pelvic Tilt  - 1 x daily - 7 x weekly - 2 sets - 10 reps - 4 hold  ASSESSMENT:  CLINICAL IMPRESSION: Today's treatment session focused on lumbar mobility and core strengthening. Patient's back was a little more painful this past week but she is currently not having increased pain. Karne has not been able to return to work completing her full work duties. The most challenging activity for her is sitting in her car and driving. She tolerated treatment session well and required minimal verbal cues for form correction. Patient will benefit from skilled PT to address  the below impairments and improve overall function.      OBJECTIVE IMPAIRMENTS: Abnormal gait, decreased mobility, difficulty walking, decreased ROM, decreased strength, increased muscle spasms, and impaired flexibility.   ACTIVITY LIMITATIONS: carrying, bending, sitting, standing, sleeping, and stairs  PARTICIPATION LIMITATIONS: driving, shopping, and community activity  PERSONAL FACTORS: Fitness are also affecting patient's functional outcome.   REHAB POTENTIAL: Good  CLINICAL DECISION MAKING: Stable/uncomplicated  EVALUATION COMPLEXITY: Low   GOALS: Goals reviewed with patient? Yes  SHORT TERM GOALS: Target date: 10/14/2023 Patient will be independent with initial HEP. Baseline:  Goal status: MET 10/11/2023  2.  Patient will report > or = to 25% improvement in walking tolerance. Baseline: 5 minutes Goal status: MET 10/11/2023 Patient reports 80% improvement  3. Patient will be able to stand for > or = to 15 minutes without increase back and Rt LE pain. Baseline:10 minutes Goal status: MET 10/11/2023   LONG TERM GOALS: Target date: 11/11/2023 Patient will demonstrate independence in advanced HEP. Baseline:  Goal status: INITIAL  2.  Patient will report > or = to 50%  improvement in walking tolerance. Baseline: 5 minutes Goal status: INITIAL  3. Patient Oswestry Score will be < or = to 12 for decreased perceived disability. Baseline: 22/50 Goal status: INITIAL  4.Patient will be able to stand for > or = to 20 minutes without increase back and Rt LE pain. Baseline: 10 minutes Goal status: met 10/18/2023   PLAN:  PT FREQUENCY: 2x/week  PT DURATION: 8 weeks  PLANNED INTERVENTIONS: Therapeutic exercises, Therapeutic activity, Neuromuscular re-education, Balance training, Gait training, Patient/Family education, Self Care, Joint mobilization, Joint manipulation, Stair training, Vestibular training, Canalith repositioning, Aquatic Therapy, Dry Needling, Electrical stimulation, Spinal manipulation, Spinal mobilization, Cryotherapy, Moist heat, Compression bandaging, scar mobilization, Splintting, Taping, Vasopneumatic device, Traction, Ultrasound, Biofeedback, Ionotophoresis 4mg /ml Dexamethasone, and Manual therapy.  PLAN FOR NEXT SESSION: seated hinging; standing tolerance   Claude Manges, PT 10/25/23 9:33 AM  [facility] [address], [address] [place], [place] [address] Phone # [phone] Fax [phone]

## 2023-10-29 ENCOUNTER — Ambulatory Visit: Payer: Medicaid Other | Admitting: Physical Therapy

## 2023-11-01 ENCOUNTER — Encounter: Payer: Self-pay | Admitting: Physical Therapy

## 2023-11-01 ENCOUNTER — Ambulatory Visit: Payer: Medicaid Other | Admitting: Physical Therapy

## 2023-11-01 DIAGNOSIS — M62838 Other muscle spasm: Secondary | ICD-10-CM

## 2023-11-01 DIAGNOSIS — M62830 Muscle spasm of back: Secondary | ICD-10-CM

## 2023-11-01 DIAGNOSIS — M25551 Pain in right hip: Secondary | ICD-10-CM

## 2023-11-01 DIAGNOSIS — M5459 Other low back pain: Secondary | ICD-10-CM | POA: Diagnosis not present

## 2023-11-01 DIAGNOSIS — R293 Abnormal posture: Secondary | ICD-10-CM

## 2023-11-01 NOTE — Therapy (Signed)
OUTPATIENT PHYSICAL THERAPY THORACOLUMBAR TREATMENT   Patient Name: Renee [name] MRN: [id number] DOB:23-Aug-1976, 47 y.o., female Today's Date: 11/01/2023  END OF SESSION:  PT End of Session - 11/01/23 1158     Visit Number 7    Date for PT Re-Evaluation 11/11/23    Authorization Type [place] MEDICAID- [facility]; Berkley Harvey req at 27 visits    PT Start Time 0932    PT Stop Time 1015    PT Time Calculation (min) 43 min    Activity Tolerance Patient tolerated treatment well    Behavior During Therapy [facility] for tasks assessed/performed                   Past Medical History:  Diagnosis Date   Anemia    No pertinent past medical history    Pelvic pain in female    Past Surgical History:  Procedure Laterality Date   BILATERAL SALPINGECTOMY N/A 08/04/2015   Procedure: LEFT SALPINGECTOMY;  Surgeon: Mitchel Honour, DO;  Location: [facility];  Service: Gynecology;  Laterality: N/A;   CESAREAN SECTION     ENDOMETRIAL ABLATION     LAPAROSCOPIC ASSISTED VAGINAL HYSTERECTOMY N/A 08/04/2015   Procedure: LAPAROSCOPIC ASSISTED VAGINAL HYSTERECTOMY;  Surgeon: Mitchel Honour, DO;  Location: [facility];  Service: Gynecology;  Laterality: N/A;   LAPAROSCOPIC LYSIS OF ADHESIONS N/A 08/04/2015   Procedure: LAPAROSCOPIC LYSIS OF ADHESIONS;  Surgeon: Mitchel Honour, DO;  Location: [facility];  Service: Gynecology;  Laterality: N/A;   TUBAL LIGATION     WISDOM TOOTH EXTRACTION     Patient Active Problem List   Diagnosis Date Noted   Obesity, morbid, BMI 50 or higher (HCC) 08/15/2020   Mild sleep apnea 01/19/2020   S/P hysterectomy 08/04/2015   Pelvic pain in female 11/17/2012    REFERRING PROVIDER: Rodolph Bong, MDCorey, Michel Harrow, MD  REFERRING DIAG: [phone] (ICD-10-CM) - Lumbar radiculitis M25.551 (ICD-10-CM) - Pain of right hip  Rationale for Evaluation and Treatment: Rehabilitation  THERAPY DIAG:  Other low back pain  Other muscle spasm  Muscle spasm of back  Pain in right hip  Abnormal  posture  ONSET DATE: 09/07/23  SUBJECTIVE:                                                                                                                                                                                           SUBJECTIVE STATEMENT: Patient reports she has been having increased back spasms. Currently her pain is 6/10.  From eval: Patient present to therapy with Rt sided lumbar back pain that started two weeks ago. She woke up with numbness in her Rt hip  flexor area and shooting pain into her quad. Patient increased her activity 2 weeks before this occurred ; she was walking 1.5 miles/day. She notices her leg will give away when she stands up after sitting for a long time. She has burning in her lateral Rt hip and numbness hip flexor area occasionally . Patient has not been able to sleep at night; she is normally a side sleeper. While working patient has to take seated rest breaks due to pain.  PERTINENT HISTORY:  None  PAIN: 11/01/2023 Are you having pain? Yes: NPRS scale: 6/10 Pain location: Rt lower lumbar area; Rt hip flexor crease Pain description: Achy; sharp; shooting; dull Aggravating factors: Laying down in any position; driving; standing > [id number]; Bending Relieving factors: Medication, massage gun   PRECAUTIONS: None  RED FLAGS: None   WEIGHT BEARING RESTRICTIONS: No  FALLS:  Has patient fallen in last 6 months? 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 Right subarticular disc protrusion at L5-S1, contacting and minimally displacing the descending right S1 nerve root in the right lateral recess. 2. Small right foraminal disc protrusion at L3-4, closely approximating and potentially irritating the exiting right L3 nerve root. 3. Small central to left  subarticular disc protrusion at L4-5 with resultant mild spinal stenosis.  PATIENT SURVEYS:  Modified Oswestry 22/50; 44% perceived disability   SCREENING FOR RED FLAGS: Bowel or bladder incontinence: No Spinal tumors: No Cauda equina syndrome: No Compression fracture: No Abdominal aneurysm: No  COGNITION: Overall cognitive status: Within functional limits for tasks assessed     POSTURE: anterior pelvic tilt  PALPATION: Palpable taut bands of Rt piriformis and Rt erector spinae  LUMBAR ROM:   AROM eval  Flexion WFL  Extension 60% limited  Right lateral flexion Above knee joint line  Left lateral flexion Bend to knee joint line  Right rotation WFL  Left rotation 60% limited   (Blank rows = not tested)   LOWER EXTREMITY MMT:    MMT Right eval Left eval  Hip flexion 3 4-  Hip extension    Hip abduction 4- 4  Hip adduction    Hip internal rotation    Hip external rotation    Knee flexion 4 pain 4  Knee extension 4 pain 4+  Ankle dorsiflexion    Ankle plantarflexion    Ankle inversion    Ankle eversion     (Blank rows = not tested)  LUMBAR SPECIAL TESTS:  Slump test: Negative  FUNCTIONAL TESTS:  5 times sit to stand: 29.43 UE on legs Timed up and go (TUG): 14.96  GAIT: Comments: Antalgic gait pattern; Decreased Lt LE step length  TODAY'S TREATMENT:  DATE:  11/01/2023 NuStep level 4 6 minutes- PT present to discuss status 3 way SB stretch x 8 each way Supine hamstring stretch with green strap 5 x 10 sec holds each leg Supine adductor stretch with green strap 5 x 10 sec holds each leg Supine LTR x 10 each Supine TA contraction + knee extension x 10 each Supine TA contraction + Hip flexion x 10 each Hip flexor stretch on 6 in stair x 30 sec- not intense enough Supine Modified thomas stretch on Rt 3 x 20 sec Supine Quad stretch  with green strap 3 x 20sec Lateral trunk flexion 5# KB 2 x 10 each side Pallof Press red loop 2 x 10 each way Manual therapy to lumbar muscles for improved circulation. PT present to monitor patient's response.   10/25/2023 NuStep level 4 5 minutes- PT present to discuss status 3 way SB stretch x 8 each way Supine hamstring stretch with green strap 2 x 20-30 sec each leg Supine adductor stretch with green strap 2 x 20-30 sec each leg Supine LTR x 10 each Supine TA contraction + Bent knee fall out x 10 each Supine TA contraction + Hip flexion x 10 each Lateral trunk flexion 5# KB 2 x 10 each side Unilateral Farmer's carries 10# KB x 4 Pallof Press red loop 2 x 10 each way Manual therapy to lumbar muscles for improved circulation. PT present to monitor patient's response.   10/18/2023 3 way SB stretch x 8 each way Supine hamstring stretch with green strap 2 x 20-30 sec each leg Supine adductor stretch with green strap 2 x 20-30 sec each leg Supine LTR x 10 each Supine TA contraction + Bent knee fall out x 10 each SL clam shells  x 10 each Seated hip flexion + TA contraction X 10 each leg Standing march with 5lb KB 2 sec hold x 10 each leg  Lateral trunk flexion 5# KB x 10 each side Unilateral Farmer's carries 10# KB x 3 Pallof Press red loop 2 x 10 each way Seated Hinging x 10 Standing hinging x 10  Picking up an object x 2  10/15/2023 NuStep level 4 5 minutes- PT present to discuss status 3 way SB stretch x 8 each way Supine LTR x 10 each Supine Posterior Pelvic Tilt x 10 Supine Hip flexion + TA contraction - stopped patient experienced increased back pain Supine TA contraction + Bent knee fall out x 10 each Supine hamstring stretch with green strap 2 x 20-30 sec each leg Supine adductor stretch with green strap 2 x 20-30 sec each leg Standing march with 5lb KB 2 sec hold x 10 each leg  Pallof Press red loop 2 x 10 each way    PATIENT EDUCATION:  Education details: hip  mobility; POC Person educated: Patient Education method: Explanation, Demonstration, and Handouts Education comprehension: verbalized understanding, returned demonstration, and needs further education  HOME EXERCISE PROGRAM: Access Code: [email or web address] URL: [email or web address] Date: 10/15/2023 Prepared by: Claude Manges  Exercises - Standing 'L' Stretch at Counter  - 1 x daily - 7 x weekly - 3 sets - 8 reps - Seated Piriformis Stretch with Trunk Bend  - 1 x daily - 7 x weekly - 3 sets - 8 reps - 20 hold - Side Lunge with Counter Support  - 1 x daily - 7 x weekly - 3 sets - 8 reps - 20 hold - Seated Hamstring Stretch  - 1 x daily - 7 x weekly -  3 sets - 8 reps - 20 hold - Supine Lower Trunk Rotation  - 1 x daily - 7 x weekly - 1 sets - 10 reps - 1-2 hold - Supine Posterior Pelvic Tilt  - 1 x daily - 7 x weekly - 2 sets - 10 reps - 4 hold  ASSESSMENT:  CLINICAL IMPRESSION: Today's treatment session focused on lumbar mobility and core strengthening. Patient experienced increased muscle spasms this week which interfered with her from coming to therapy. Patient verbalized that she does not feel 100% better and would like to continue therapy. Patient has not been compliant with her HEP. Educated her that completing her home exercises are improvement to continue improvements she is making in therapy. Stefania is slowly building her work case load and she has not worked her normal work day. Progressed patient's core exercises today and she responded well. Supine hip flexor and quad stretch was beneficial for her. Educated patient on the benefit of dry needling and she verbalized interest. Patient will benefit from skilled PT to address the below impairments and improve overall function.       OBJECTIVE IMPAIRMENTS: Abnormal gait, decreased mobility, difficulty walking, decreased ROM, decreased strength, increased muscle spasms, and impaired flexibility.   ACTIVITY LIMITATIONS: carrying,  bending, sitting, standing, sleeping, and stairs  PARTICIPATION LIMITATIONS: driving, shopping, and community activity  PERSONAL FACTORS: Fitness are also affecting patient's functional outcome.   REHAB POTENTIAL: Good  CLINICAL DECISION MAKING: Stable/uncomplicated  EVALUATION COMPLEXITY: Low   GOALS: Goals reviewed with patient? Yes  SHORT TERM GOALS: Target date: 10/14/2023 Patient will be independent with initial HEP. Baseline:  Goal status: MET 10/11/2023  2.  Patient will report > or = to 25% improvement in walking tolerance. Baseline: 5 minutes Goal status: MET 10/11/2023 Patient reports 80% improvement  3. Patient will be able to stand for > or = to 15 minutes without increase back and Rt LE pain. Baseline:10 minutes Goal status: MET 10/11/2023   LONG TERM GOALS: Target date: 11/11/2023 Patient will demonstrate independence in advanced HEP. Baseline:  Goal status: INITIAL  2.  Patient will report > or = to 50% improvement in walking tolerance. Baseline: 5 minutes Goal status: INITIAL  3. Patient Oswestry Score will be < or = to 12 for decreased perceived disability. Baseline: 22/50 Goal status: INITIAL  4.Patient will be able to stand for > or = to 20 minutes without increase back and Rt LE pain. Baseline: 10 minutes Goal status: met 10/18/2023   PLAN:  PT FREQUENCY: 2x/week  PT DURATION: 8 weeks  PLANNED INTERVENTIONS: Therapeutic exercises, Therapeutic activity, Neuromuscular re-education, Balance training, Gait training, Patient/Family education, Self Care, Joint mobilization, Joint manipulation, Stair training, Vestibular training, Canalith repositioning, Aquatic Therapy, Dry Needling, Electrical stimulation, Spinal manipulation, Spinal mobilization, Cryotherapy, Moist heat, Compression bandaging, scar mobilization, Splintting, Taping, Vasopneumatic device, Traction, Ultrasound, Biofeedback, Ionotophoresis 4mg /ml Dexamethasone, and Manual  therapy.  PLAN FOR NEXT SESSION: re-certification next visit (1x week for 6 weeks); provide DN handout; continue progressing core exercises   Claude Manges, PT 11/01/23 11:59 AM [facility] [address], [address] [place], [place] [address] Phone # [phone] Fax [phone]

## 2023-11-06 ENCOUNTER — Encounter: Payer: Self-pay | Admitting: Physical Therapy

## 2023-11-06 ENCOUNTER — Ambulatory Visit: Payer: Medicaid Other | Admitting: Physical Therapy

## 2023-11-06 DIAGNOSIS — M5459 Other low back pain: Secondary | ICD-10-CM

## 2023-11-06 DIAGNOSIS — M62830 Muscle spasm of back: Secondary | ICD-10-CM

## 2023-11-06 DIAGNOSIS — M62838 Other muscle spasm: Secondary | ICD-10-CM

## 2023-11-06 DIAGNOSIS — R293 Abnormal posture: Secondary | ICD-10-CM

## 2023-11-06 DIAGNOSIS — M25551 Pain in right hip: Secondary | ICD-10-CM

## 2023-11-06 NOTE — Therapy (Signed)
OUTPATIENT PHYSICAL THERAPY THORACOLUMBAR TREATMENT /RE-CERTIFICATION   Patient Name: Renee [name] MRN: [id number] DOB:1976/10/14, 47 y.o., female Today's Date: 11/06/2023  END OF SESSION:  PT End of Session - 11/06/23 0845     Visit Number 8    Date for PT Re-Evaluation 12/18/23    Authorization Type [place] MEDICAID- [facility]; Berkley Harvey req at 27 visits    PT Start Time 0803    PT Stop Time 0841    PT Time Calculation (min) 38 min    Activity Tolerance Patient tolerated treatment well    Behavior During Therapy [facility] for tasks assessed/performed                    Past Medical History:  Diagnosis Date   Anemia    No pertinent past medical history    Pelvic pain in female    Past Surgical History:  Procedure Laterality Date   BILATERAL SALPINGECTOMY N/A 08/04/2015   Procedure: LEFT SALPINGECTOMY;  Surgeon: Mitchel Honour, DO;  Location: [facility];  Service: Gynecology;  Laterality: N/A;   CESAREAN SECTION     ENDOMETRIAL ABLATION     LAPAROSCOPIC ASSISTED VAGINAL HYSTERECTOMY N/A 08/04/2015   Procedure: LAPAROSCOPIC ASSISTED VAGINAL HYSTERECTOMY;  Surgeon: Mitchel Honour, DO;  Location: [facility];  Service: Gynecology;  Laterality: N/A;   LAPAROSCOPIC LYSIS OF ADHESIONS N/A 08/04/2015   Procedure: LAPAROSCOPIC LYSIS OF ADHESIONS;  Surgeon: Mitchel Honour, DO;  Location: [facility];  Service: Gynecology;  Laterality: N/A;   TUBAL LIGATION     WISDOM TOOTH EXTRACTION     Patient Active Problem List   Diagnosis Date Noted   Obesity, morbid, BMI 50 or higher (HCC) 08/15/2020   Mild sleep apnea 01/19/2020   S/P hysterectomy 08/04/2015   Pelvic pain in female 11/17/2012    REFERRING PROVIDER: Rodolph Bong, MDCorey, Michel Harrow, MD  REFERRING DIAG: M54.16 (ICD-10-CM) - Lumbar radiculitis M25.551 (ICD-10-CM) - Pain of right hip  Rationale for Evaluation and Treatment: Rehabilitation  THERAPY DIAG:  Other low back pain - Plan: PT plan of care cert/re-cert  Other muscle spasm -  Plan: PT plan of care cert/re-cert  Muscle spasm of back - Plan: PT plan of care cert/re-cert  Pain in right hip - Plan: PT plan of care cert/re-cert  Abnormal posture - Plan: PT plan of care cert/re-cert  ONSET DATE: 09/07/23  SUBJECTIVE:                                                                                                                                                                                           SUBJECTIVE STATEMENT: Patient reports she is  doing good today. She is not currently having pain she just feels stiff.  From eval: Patient present to therapy with Rt sided lumbar back pain that started two weeks ago. She woke up with numbness in her Rt hip flexor area and shooting pain into her quad. Patient increased her activity 2 weeks before this occurred ; she was walking 1.5 miles/day. She notices her leg will give away when she stands up after sitting for a long time. She has burning in her lateral Rt hip and numbness hip flexor area occasionally . Patient has not been able to sleep at night; she is normally a side sleeper. While working patient has to take seated rest breaks due to pain.  PERTINENT HISTORY:  None  PAIN: 11/06/2023 Are you having pain? Yes: NPRS scale: 0/10 Pain location: Rt lower lumbar area; Rt hip flexor crease Pain description: Achy; sharp; shooting; dull Aggravating factors: Laying down in any position; driving; standing > [id number]; Bending Relieving factors: Medication, massage gun   PRECAUTIONS: None  RED FLAGS: None   WEIGHT BEARING RESTRICTIONS: No  FALLS:  Has patient fallen in last 6 months? 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 Right subarticular disc protrusion at L5-S1, contacting and minimally displacing the descending  right S1 nerve root in the right lateral recess. 2. Small right foraminal disc protrusion at L3-4, closely approximating and potentially irritating the exiting right L3 nerve root. 3. Small central to left subarticular disc protrusion at L4-5 with resultant mild spinal stenosis.  PATIENT SURVEYS:  Modified Oswestry 22/50; 44% perceived disability  11/06/2023 : 6/50 12% perceived disability  SCREENING FOR RED FLAGS: Bowel or bladder incontinence: No Spinal tumors: No Cauda equina syndrome: No Compression fracture: No Abdominal aneurysm: No  COGNITION: Overall cognitive status: Within functional limits for tasks assessed     POSTURE: anterior pelvic tilt  PALPATION: Palpable taut bands of Rt piriformis and Rt erector spinae  LUMBAR ROM:   AROM eval  Flexion WFL  Extension 60% limited  Right lateral flexion Above knee joint line  Left lateral flexion Bend to knee joint line  Right rotation WFL  Left rotation 60% limited   (Blank rows = not tested)   LOWER EXTREMITY MMT:    MMT Right eval Left eval  Hip flexion 3 4-  Hip extension    Hip abduction 4- 4  Hip adduction    Hip internal rotation    Hip external rotation    Knee flexion 4 pain 4  Knee extension 4 pain 4+  Ankle dorsiflexion    Ankle plantarflexion    Ankle inversion    Ankle eversion     (Blank rows = not tested)  LUMBAR SPECIAL TESTS:  Slump test: Negative  FUNCTIONAL TESTS:  5 times sit to stand: 29.43 UE on legs Timed up and go (TUG): 14.96  11/06/2023 5 STS: 17.46 sec no UE support TUG: 9.62 sec  GAIT: Comments: Antalgic gait pattern; Decreased Lt LE step length  TODAY'S TREATMENT:  DATE:  11/06/2023 NuStep level 4 7 minutes- PT present to discuss status 5 STS: 17.46 sec no UE support TUG: 9.62 sec 3 way SB stretch x 8 each way Lower Trunk Rotation x 10  each way Supine hamstring stretch with green strap 5 x 10 sec holds each leg Supine adductor stretch with green strap 5 x 10 sec holds each leg Supine Unilateral ball press + knee extension x 10 each Supine TA contraction + Hip flexion x 10 each Hooklying ball press x 20  Standing Hip abduction 2.5# AW 2 x 10  Unilateral [employer] Carries 10# KB x 2 around gym Lateral trunk flexion 10# KB 2 x 10 each side ODI: 6/50 12%   11/01/2023 NuStep level 4 6 minutes- PT present to discuss status 3 way SB stretch x 8 each way Supine hamstring stretch with green strap 5 x 10 sec holds each leg Supine adductor stretch with green strap 5 x 10 sec holds each leg Supine LTR x 10 each Supine TA contraction + knee extension x 10 each Supine TA contraction + Hip flexion x 10 each Hip flexor stretch on 6 in stair x 30 sec- not intense enough Supine Modified thomas stretch on Rt 3 x 20 sec Supine Quad stretch with green strap 3 x 20sec Lateral trunk flexion 5# KB 2 x 10 each side Pallof Press red loop 2 x 10 each way Manual therapy to lumbar muscles for improved circulation. PT present to monitor patient's response.   10/25/2023 NuStep level 4 5 minutes- PT present to discuss status 3 way SB stretch x 8 each way Supine hamstring stretch with green strap 2 x 20-30 sec each leg Supine adductor stretch with green strap 2 x 20-30 sec each leg Supine LTR x 10 each Supine TA contraction + Bent knee fall out x 10 each Supine TA contraction + Hip flexion x 10 each Lateral trunk flexion 5# KB 2 x 10 each side Unilateral Farmer's carries 10# KB x 4 Pallof Press red loop 2 x 10 each way Manual therapy to lumbar muscles for improved circulation. PT present to monitor patient's response.   PATIENT EDUCATION:  Education details: hip mobility; POC Person educated: Patient Education method: Explanation, Demonstration, and Handouts Education comprehension: verbalized understanding, returned demonstration, and  needs further education  HOME EXERCISE PROGRAM: Access Code: [email or web address] URL: [email or web address] Date: 10/15/2023 Prepared by: Claude Manges  Exercises - Standing 'L' Stretch at Counter  - 1 x daily - 7 x weekly - 3 sets - 8 reps - Seated Piriformis Stretch with Trunk Bend  - 1 x daily - 7 x weekly - 3 sets - 8 reps - 20 hold - Side Lunge with Counter Support  - 1 x daily - 7 x weekly - 3 sets - 8 reps - 20 hold - Seated Hamstring Stretch  - 1 x daily - 7 x weekly - 3 sets - 8 reps - 20 hold - Supine Lower Trunk Rotation  - 1 x daily - 7 x weekly - 1 sets - 10 reps - 1-2 hold - Supine Posterior Pelvic Tilt  - 1 x daily - 7 x weekly - 2 sets - 10 reps - 4 hold  ASSESSMENT:  CLINICAL IMPRESSION: Since starting therapy [place] reports feeling 80% better. She has noted improvements in her standing tolerance and sitting tolerance since starting therapy. Bending down to pick up objects and standing long enough to perform multiple massages throughout the day are still challenging for patient.  She is going back to work full time started tomorrow. She still experiences occasional muscle spasms but overall they are less frequent. Noted improved times with 5 STS and TUG and patient is not considered a fall risk based on times. Patient's right side is still very weak compared to her Lt and she requires rest breaks during treatment session. Educated patient on the benefit of dry needling and patient verbalized interest. Patient will continue to benefit from skilled therapy and incorporate dry needling into treatment sessions.     OBJECTIVE IMPAIRMENTS: Abnormal gait, decreased mobility, difficulty walking, decreased ROM, decreased strength, increased muscle spasms, and impaired flexibility.   ACTIVITY LIMITATIONS: carrying, bending, sitting, standing, sleeping, and stairs  PARTICIPATION LIMITATIONS: driving, shopping, and community activity  PERSONAL FACTORS: Fitness are also affecting  patient's functional outcome.   REHAB POTENTIAL: Good  CLINICAL DECISION MAKING: Stable/uncomplicated  EVALUATION COMPLEXITY: Low   GOALS: Goals reviewed with patient? Yes  SHORT TERM GOALS: Target date: 10/14/2023 Patient will be independent with initial HEP. Baseline:  Goal status: MET 10/11/2023  2.  Patient will report > or = to 25% improvement in walking tolerance. Baseline: 5 minutes Goal status: MET 10/11/2023 Patient reports 80% improvement  3. Patient will be able to stand for > or = to 15 minutes without increase back and Rt LE pain. Baseline:10 minutes Goal status: MET 10/11/2023   LONG TERM GOALS: Target date: 12/18/2023 Patient will demonstrate independence in advanced HEP. Baseline:  Goal status: IN PROGRESS 11/06/2023  2.  Patient will report > or = to 50% improvement in walking tolerance. Baseline: 5 minutes Goal status: MET 15 minutes 11/06/2023  3. Patient Oswestry Score will be < or = to 12 for decreased perceived disability. Baseline: 22/50 Goal status: MET 11/06/2023 6/50  4.Patient will be able to stand for > or = to 20 minutes without increase back and Rt LE pain. Baseline: 10 minutes Goal status: MET 10/18/2023  5. Patient will be able to complete a full work day with < or = to 2/10 back pain Baseline:  Goal status: NEW  6. Patient will be able to bend and pick up objects with < or = to 2/10 back pain Baseline:  Goal status: NEW PLAN:  PT FREQUENCY: 1x/week  PT DURATION: 6 weeks  PLANNED INTERVENTIONS: Therapeutic exercises, Therapeutic activity, Neuromuscular re-education, Balance training, Gait training, Patient/Family education, Self Care, Joint mobilization, Joint manipulation, Stair training, Vestibular training, Canalith repositioning, Aquatic Therapy, Dry Needling, Electrical stimulation, Spinal manipulation, Spinal mobilization, Cryotherapy, Moist heat, Compression bandaging, scar mobilization, Splintting, Taping, Vasopneumatic  device, Traction, Ultrasound, Biofeedback, Ionotophoresis 4mg /ml Dexamethasone, and Manual therapy.  PLAN FOR NEXT SESSION: continue progressing core exercises; trunk extensor strengthening; dry needling; update POC to include core exercises   Claude Manges, PT 11/06/23 8:50 AM [facility] [address] [place], [place] [address] Phone # [phone] Fax [phone]

## 2023-11-08 ENCOUNTER — Encounter: Payer: Medicaid Other | Admitting: Physical Therapy

## 2023-11-12 ENCOUNTER — Ambulatory Visit: Payer: Medicaid Other | Admitting: Physical Therapy

## 2023-11-12 DIAGNOSIS — M62838 Other muscle spasm: Secondary | ICD-10-CM

## 2023-11-12 DIAGNOSIS — M5459 Other low back pain: Secondary | ICD-10-CM | POA: Diagnosis not present

## 2023-11-12 NOTE — Patient Instructions (Signed)
TENS UNIT  This is helpful for muscle pain and spasm.   Search and Purchase a TENS 7000 2nd edition at www.tenspros.com or www.amazon.com  (It should be less than $30)     TENS unit instructions:  Do not shower or bathe with the unit on Turn the unit off before removing electrodes or batteries If the electrodes lose stickiness add a drop of water to the electrodes after they are disconnected from the unit and place on plastic sheet. If you continued to have difficulty, call the TENS unit company to purchase more electrodes. Do not apply lotion on the skin area prior to use. Make sure the skin is clean and dry as this will help prolong the life of the electrodes. After use, always check skin for unusual red areas, rash or other skin difficulties. If there are any skin problems, does not apply electrodes to the same area. Never remove the electrodes from the unit by pulling the wires. Do not use the TENS unit or electrodes other than as directed. Do not change electrode placement without consulting your therapist or physician. Keep 2 fingers with between each electrode.    Trigger Point Dry Needling  What is Trigger Point Dry Needling (DN)? DN is a physical therapy technique used to treat muscle pain and dysfunction. Specifically, DN helps deactivate muscle trigger points (muscle knots).  A thin filiform needle is used to penetrate the skin and stimulate the underlying trigger point. The goal is for a local twitch response (LTR) to occur and for the trigger point to relax. No medication of any kind is injected during the procedure.   What Does Trigger Point Dry Needling Feel Like?  The procedure feels different for each individual patient. Some patients report that they do not actually feel the needle enter the skin and overall the process is not painful. Very mild bleeding may occur. However, many patients feel a deep cramping in the muscle in which the needle was inserted. This is the  local twitch response.   How Will I feel after the treatment? Soreness is normal, and the onset of soreness may not occur for a few hours. Typically this soreness does not last longer than two days.  Bruising is uncommon, however; ice can be used to decrease any possible bruising.  In rare cases feeling tired or nauseous after the treatment is normal. In addition, your symptoms may get worse before they get better, this period will typically not last longer than 24 hours.   What Can I do After My Treatment? Increase your hydration by drinking more water for the next 24 hours. You may place ice or heat on the areas treated that have become sore, however, do not use heat on inflamed or bruised areas. Heat often brings more relief post needling. You can continue your regular activities, but vigorous activity is not recommended initially after the treatment for 24 hours. DN is best combined with other physical therapy such as strengthening, stretching, and other therapies.

## 2023-11-12 NOTE — Therapy (Signed)
OUTPATIENT PHYSICAL THERAPY THORACOLUMBAR TREATMENT /RE-CERTIFICATION   Patient Name: Renee [name] MRN: [id number] DOB:05-Jan-1976, 47 y.o., female Today's Date: 11/12/2023  END OF SESSION:  PT End of Session - 11/12/23 1017     Visit Number 9    Date for PT Re-Evaluation 12/18/23    Authorization Type [place] MED[employer]- [employer]; Berkley Harvey req at 27 visits    PT Start Time 1017    PT Stop Time 1058    PT Time Calculation (min) 41 min    Activity Tolerance Patient tolerated treatment well                    Past Medical History:  Diagnosis Date   Anemia    No pertinent past medical history    Pelvic pain in female    Past Surgical History:  Procedure Laterality Date   BILATERAL SALPINGECTOMY N/A 08/04/2015   Procedure: LEFT SALPINGECTOMY;  Surgeon: Mitchel Honour, DO;  Location: [facility];  Service: Gynecology;  Laterality: N/A;   CESAREAN SECTION     ENDOMETRIAL ABLATION     LAPAROSCOPIC ASSISTED VAGINAL HYSTERECTOMY N/A 08/04/2015   Procedure: LAPAROSCOPIC ASSISTED VAGINAL HYSTERECTOMY;  Surgeon: Mitchel Honour, DO;  Location: [facility];  Service: Gynecology;  Laterality: N/A;   LAPAROSCOPIC LYSIS OF ADHESIONS N/A 08/04/2015   Procedure: LAPAROSCOPIC LYSIS OF ADHESIONS;  Surgeon: Mitchel Honour, DO;  Location: [facility];  Service: Gynecology;  Laterality: N/A;   TUBAL LIGATION     WISDOM TOOTH EXTRACTION     Patient Active Problem List   Diagnosis Date Noted   Obesity, morbid, BMI 50 or higher (HCC) 08/15/2020   Mild sleep apnea 01/19/2020   S/P hysterectomy 08/04/2015   Pelvic pain in female 11/17/2012    REFERRING PROVIDER: Rodolph Bong, MDCorey, Michel Harrow, MD  REFERRING DIAG: M54.16 (ICD-10-CM) - Lumbar radiculitis M25.551 (ICD-10-CM) - Pain of right hip  Rationale for Evaluation and Treatment: Rehabilitation  THERAPY DIAG:  Other low back pain  Other muscle spasm  ONSET DATE: 09/07/23  SUBJECTIVE:                                                                                                                                                                                            SUBJECTIVE STATEMENT: I usually feel better after exercise. 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 I was out of work 1 month.  Interested in trying DN.   From eval: Patient present to therapy with Rt sided lumbar back pain that started two weeks ago. She woke up with numbness in her  Rt hip flexor area and shooting pain into her quad. Patient increased her activity 2 weeks before this occurred ; she was walking 1.5 miles/day. She notices her leg will give away when she stands up after sitting for a long time. She has burning in her lateral Rt hip and numbness hip flexor area occasionally . Patient has not been able to sleep at night; she is normally a side sleeper. While working patient has to take seated rest breaks due to pain.  PERTINENT HISTORY:  None  PAIN: 11/06/2023 Are you having pain? Yes: NPRS scale: 5-6/10 Pain location: Rt buttock and some spasms right low back Pain description: Achy; sharp; shooting; dull Aggravating factors: Laying down in any position; driving; standing > 14-78GNFA; Bending Relieving factors: Medication, massage gun   PRECAUTIONS: None  RED FLAGS: None   WEIGHT BEARING RESTRICTIONS: No  FALLS:  Has patient fallen in last 6 months? 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 Right subarticular disc protrusion at L5-S1, contacting and minimally displacing the descending right S1 nerve root in the right lateral recess. 2. Small right foraminal disc protrusion at L3-4, closely approximating and potentially irritating the exiting right L3 nerve root. 3. Small central to left subarticular disc  protrusion at L4-5 with resultant mild spinal stenosis.  PATIENT SURVEYS:  Modified Oswestry 22/50; 44% perceived disability  11/06/2023 : 6/50 12% perceived disability  SCREENING FOR RED FLAGS: Bowel or bladder incontinence: No Spinal tumors: No Cauda equina syndrome: No Compression fracture: No Abdominal aneurysm: No  COGNITION: Overall cognitive status: Within functional limits for tasks assessed     POSTURE: anterior pelvic tilt  PALPATION: Palpable taut bands of Rt piriformis and Rt erector spinae  LUMBAR ROM:   AROM eval  Flexion WFL  Extension 60% limited  Right lateral flexion Above knee joint line  Left lateral flexion Bend to knee joint line  Right rotation WFL  Left rotation 60% limited   (Blank rows = not tested)   LOWER EXTREMITY MMT:    MMT Right eval Left eval  Hip flexion 3 4-  Hip extension    Hip abduction 4- 4  Hip adduction    Hip internal rotation    Hip external rotation    Knee flexion 4 pain 4  Knee extension 4 pain 4+  Ankle dorsiflexion    Ankle plantarflexion    Ankle inversion    Ankle eversion     (Blank rows = not tested)  LUMBAR SPECIAL TESTS:  Slump test: Negative  FUNCTIONAL TESTS:  5 times sit to stand: 29.43 UE on legs Timed up and go (TUG): 14.96  11/06/2023 5 STS: 17.46 sec no UE support TUG: 9.62 sec  GAIT: Comments: Antalgic gait pattern; Decreased Lt LE step length  TODAY'S TREATMENT:  DATE:  11/12/2023 TENS info Manual therapy: soft tissue mobilization to right gluteals and right lumbar musculature Trigger Point Dry-Needling  Treatment instructions: Expect mild to moderate muscle soreness. S/S of pneumothorax if dry needled over a lung field, and to seek immediate medical attention should they occur. Patient verbalized understanding of these instructions and education.  Patient  Consent Given: Yes Education handout provided: Yes Muscles treated: sidelying right gluteals; prone right lumbar multifidi L1-L5 Electrical stimulation performed: No Parameters: N/A Treatment response/outcome: decreased tender point size and number and improved soft tissue mobility  Heat following DN 2 min Discussed ex's that would be easy to do during work day Seated QL stretch per HEP Standing QL stretch in doorway per HEP Seated piriformis stretch on edge of table per HEP   11/06/2023 NuStep level 4 7 minutes- PT present to discuss status 5 STS: 17.46 sec no UE support TUG: 9.62 sec 3 way SB stretch x 8 each way Lower Trunk Rotation x 10 each way Supine hamstring stretch with green strap 5 x 10 sec holds each leg Supine adductor stretch with green strap 5 x 10 sec holds each leg Supine Unilateral ball press + knee extension x 10 each Supine TA contraction + Hip flexion x 10 each Hooklying ball press x 20  Standing Hip abduction 2.5# AW 2 x 10  Unilateral Farmers Carries 10# KB x 2 around gym Lateral trunk flexion 10# KB 2 x 10 each side ODI: 6/50 12%   11/01/2023 NuStep level 4 6 minutes- PT present to discuss status 3 way SB stretch x 8 each way Supine hamstring stretch with green strap 5 x 10 sec holds each leg Supine adductor stretch with green strap 5 x 10 sec holds each leg Supine LTR x 10 each Supine TA contraction + knee extension x 10 each Supine TA contraction + Hip flexion x 10 each Hip flexor stretch on 6 in stair x 30 sec- not intense enough Supine Modified thomas stretch on Rt 3 x 20 sec Supine Quad stretch with green strap 3 x 20sec Lateral trunk flexion 5# KB 2 x 10 each side Pallof Press red loop 2 x 10 each way Manual therapy to lumbar muscles for improved circulation. PT present to monitor patient's response.   10/25/2023 NuStep level 4 5 minutes- PT present to discuss status 3 way SB stretch x 8 each way Supine hamstring stretch with green strap 2  x 20-30 sec each leg Supine adductor stretch with green strap 2 x 20-30 sec each leg Supine LTR x 10 each Supine TA contraction + Bent knee fall out x 10 each Supine TA contraction + Hip flexion x 10 each Lateral trunk flexion 5# KB 2 x 10 each side Unilateral Farmer's carries 10# KB x 4 Pallof Press red loop 2 x 10 each way Manual therapy to lumbar muscles for improved circulation. PT present to monitor patient's response.   PATIENT EDUCATION:  Education details: hip mobility; POC;  TENS info, DN info Person educated: Patient Education method: Explanation, Demonstration, and Handouts Education comprehension: verbalized understanding, returned demonstration, and needs further education  HOME EXERCISE PROGRAM: Access Code: [email or web address] URL: [email or web address] Date: 11/12/2023 Prepared by: Lavinia Sharps  Exercises - Standing 'L' Stretch at Counter  - 1 x daily - 7 x weekly - 3 sets - 8 reps - Seated Piriformis Stretch with Trunk Bend  - 1 x daily - 7 x weekly - 3 sets - 8 reps - 20 hold - Side Lunge with  Counter Support  - 1 x daily - 7 x weekly - 3 sets - 8 reps - 20 hold - Seated Hamstring Stretch  - 1 x daily - 7 x weekly - 3 sets - 8 reps - 20 hold - Supine Lower Trunk Rotation  - 1 x daily - 7 x weekly - 1 sets - 10 reps - 1-2 hold - Supine Posterior Pelvic Tilt  - 1 x daily - 7 x weekly - 2 sets - 10 reps - 4 hold - Standing Quadratus Lumborum Stretch with Doorway (Mirrored)  - 1 x daily - 7 x weekly - 1 sets - 20 hold - Seated Table Piriformis Stretch  - 1 x daily - 7 x weekly - 1 sets - 3 reps - 20 hold - Seated Quadratus Lumborum Stretch in Chair  - 1 x daily - 7 x weekly - 1 sets - 3 reps - 20 hold  ASSESSMENT:  CLINICAL IMPRESSION: The patient had a positive initial response to DN with much improved soft tissue mobility and decreased size and number of tender points.  Therapist monitoring response and educating patient on what to expect following DN and how  to optimize benefit with specific exercise.  Anticipate pain intensity and mobility will continue to improve over the next few days.  May add right hip flexors and left lumbar multifidi next time.  HEP updated to include ex's she could do during the workday.       OBJECTIVE IMPAIRMENTS: Abnormal gait, decreased mobility, difficulty walking, decreased ROM, decreased strength, increased muscle spasms, and impaired flexibility.   ACTIVITY LIMITATIONS: carrying, bending, sitting, standing, sleeping, and stairs  PARTICIPATION LIMITATIONS: driving, shopping, and community activity  PERSONAL FACTORS: Fitness are also affecting patient's functional outcome.   REHAB POTENTIAL: Good  CLINICAL DECISION MAKING: Stable/uncomplicated  EVALUATION COMPLEXITY: Low   GOALS: Goals reviewed with patient? Yes  SHORT TERM GOALS: Target date: 10/14/2023 Patient will be independent with initial HEP. Baseline:  Goal status: MET 10/11/2023  2.  Patient will report > or = to 25% improvement in walking tolerance. Baseline: 5 minutes Goal status: MET 10/11/2023 Patient reports 80% improvement  3. Patient will be able to stand for > or = to 15 minutes without increase back and Rt LE pain. Baseline:10 minutes Goal status: MET 10/11/2023   LONG TERM GOALS: Target date: 12/18/2023 Patient will demonstrate independence in advanced HEP. Baseline:  Goal status: IN PROGRESS 11/06/2023  2.  Patient will report > or = to 50% improvement in walking tolerance. Baseline: 5 minutes Goal status: MET 15 minutes 11/06/2023  3. Patient Oswestry Score will be < or = to 12 for decreased perceived disability. Baseline: 22/50 Goal status: MET 11/06/2023 6/50  4.Patient will be able to stand for > or = to 20 minutes without increase back and Rt LE pain. Baseline: 10 minutes Goal status: MET 10/18/2023  5. Patient will be able to complete a full work day with < or = to 2/10 back pain Baseline:  Goal status:  NEW  6. Patient will be able to bend and pick up objects with < or = to 2/10 back pain Baseline:  Goal status: NEW PLAN:  PT FREQUENCY: 1x/week  PT DURATION: 6 weeks  PLANNED INTERVENTIONS: Therapeutic exercises, Therapeutic activity, Neuromuscular re-education, Balance training, Gait training, Patient/Family education, Self Care, Joint mobilization, Joint manipulation, Stair training, Vestibular training, Canalith repositioning, Aquatic Therapy, Dry Needling, Electrical stimulation, Spinal manipulation, Spinal mobilization, Cryotherapy, Moist heat, Compression bandaging, scar mobilization, Splintting,  Taping, Vasopneumatic device, Traction, Ultrasound, Biofeedback, Ionotophoresis 4mg /ml Dexamethasone, and Manual therapy.  PLAN FOR NEXT SESSION: assess DN #1 and may add DN to left lumbar multifidi and right hip flexor; continue progressing core exercises; trunk extensor strengthening; dry needling; update POC to include core exercises  Lavinia Sharps, PT 11/12/23 11:03 AM Phone: [phone] Fax: [phone]  [facility] [address] [place], [place] [address] Phone # [phone] Fax [phone]

## 2023-11-19 ENCOUNTER — Ambulatory Visit: Payer: Medicaid Other

## 2023-11-19 DIAGNOSIS — M5459 Other low back pain: Secondary | ICD-10-CM

## 2023-11-19 DIAGNOSIS — M6281 Muscle weakness (generalized): Secondary | ICD-10-CM

## 2023-11-19 DIAGNOSIS — R252 Cramp and spasm: Secondary | ICD-10-CM

## 2023-11-19 DIAGNOSIS — M62838 Other muscle spasm: Secondary | ICD-10-CM

## 2023-11-19 DIAGNOSIS — R262 Difficulty in walking, not elsewhere classified: Secondary | ICD-10-CM

## 2023-11-19 DIAGNOSIS — R293 Abnormal posture: Secondary | ICD-10-CM

## 2023-11-19 DIAGNOSIS — M25551 Pain in right hip: Secondary | ICD-10-CM

## 2023-11-19 DIAGNOSIS — M62830 Muscle spasm of back: Secondary | ICD-10-CM

## 2023-11-19 NOTE — Therapy (Signed)
OUTPATIENT PHYSICAL THERAPY THORACOLUMBAR TREATMENT    Patient Name: Renee [name] MRN: [id number] DOB:1976-11-18, 47 y.o., female Today's Date: 11/19/2023  END OF SESSION:  PT End of Session - 11/19/23 0850     Visit Number 10    Date for PT Re-Evaluation 12/18/23    Authorization Type [place] MEDICAID- [employer]; Berkley Harvey req at 27 visits    PT Start Time 0850    PT Stop Time 0928    PT Time Calculation (min) 38 min    Activity Tolerance Patient tolerated treatment well    Behavior During Therapy [facility] for tasks assessed/performed                    Past Medical History:  Diagnosis Date   Anemia    No pertinent past medical history    Pelvic pain in female    Past Surgical History:  Procedure Laterality Date   BILATERAL SALPINGECTOMY N/A 08/04/2015   Procedure: LEFT SALPINGECTOMY;  Surgeon: Mitchel Honour, DO;  Location: [facility];  Service: Gynecology;  Laterality: N/A;   CESAREAN SECTION     ENDOMETRIAL ABLATION     LAPAROSCOPIC ASSISTED VAGINAL HYSTERECTOMY N/A 08/04/2015   Procedure: LAPAROSCOPIC ASSISTED VAGINAL HYSTERECTOMY;  Surgeon: Mitchel Honour, DO;  Location: [facility];  Service: Gynecology;  Laterality: N/A;   LAPAROSCOPIC LYSIS OF ADHESIONS N/A 08/04/2015   Procedure: LAPAROSCOPIC LYSIS OF ADHESIONS;  Surgeon: Mitchel Honour, DO;  Location: [facility];  Service: Gynecology;  Laterality: N/A;   TUBAL LIGATION     WISDOM TOOTH EXTRACTION     Patient Active Problem List   Diagnosis Date Noted   Obesity, morbid, BMI 50 or higher (HCC) 08/15/2020   Mild sleep apnea 01/19/2020   S/P hysterectomy 08/04/2015   Pelvic pain in female 11/17/2012    REFERRING PROVIDER: Rodolph Bong, MDCorey, Michel Harrow, MD  REFERRING DIAG: [phone] (ICD-10-CM) - Lumbar radiculitis M25.551 (ICD-10-CM) - Pain of right hip  Rationale for Evaluation and Treatment: Rehabilitation  THERAPY DIAG:  Other low back pain  Other muscle spasm  Muscle spasm of back  Pain in right hip  Abnormal  posture  Muscle weakness (generalized)  Difficulty in walking, not elsewhere classified  Cramp and spasm  ONSET DATE: 09/07/23  SUBJECTIVE:                                                                                                                                                                                           SUBJECTIVE STATEMENT: Patient reports soreness but had good relief with DN.  She is back to work as a Teacher, adult education and this seems to be  making her a little sore too.  She reports that she is about 90% better from the time that she started PT.   She reports her pain at 4/10 today.  From eval: Patient present to therapy with Rt sided lumbar back pain that started two weeks ago. She woke up with numbness in her Rt hip flexor area and shooting pain into her quad. Patient increased her activity 2 weeks before this occurred ; she was walking 1.5 miles/day. She notices her leg will give away when she stands up after sitting for a long time. She has burning in her lateral Rt hip and numbness hip flexor area occasionally . Patient has not been able to sleep at night; she is normally a side sleeper. While working patient has to take seated rest breaks due to pain.  PERTINENT HISTORY:  None  PAIN: 11/06/2023 Are you having pain? Yes: NPRS scale: 5-6/10 Pain location: Rt buttock and some spasms right low back Pain description: Achy; sharp; shooting; dull Aggravating factors: Laying down in any position; driving; standing > [id number]; Bending Relieving factors: Medication, massage gun   PRECAUTIONS: None  RED FLAGS: None   WEIGHT BEARING RESTRICTIONS: No  FALLS:  Has patient fallen in last 6 months? 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 Right subarticular disc  protrusion at L5-S1, contacting and minimally displacing the descending right S1 nerve root in the right lateral recess. 2. Small right foraminal disc protrusion at L3-4, closely approximating and potentially irritating the exiting right L3 nerve root. 3. Small central to left subarticular disc protrusion at L4-5 with resultant mild spinal stenosis.  PATIENT SURVEYS:  Modified Oswestry 22/50; 44% perceived disability  11/06/2023 : 6/50 12% perceived disability  SCREENING FOR RED FLAGS: Bowel or bladder incontinence: No Spinal tumors: No Cauda equina syndrome: No Compression fracture: No Abdominal aneurysm: No  COGNITION: Overall cognitive status: Within functional limits for tasks assessed     POSTURE: anterior pelvic tilt  PALPATION: Palpable taut bands of Rt piriformis and Rt erector spinae  LUMBAR ROM:   AROM eval  Flexion WFL  Extension 60% limited  Right lateral flexion Above knee joint line  Left lateral flexion Bend to knee joint line  Right rotation WFL  Left rotation 60% limited   (Blank rows = not tested)   LOWER EXTREMITY MMT:    MMT Right eval Left eval  Hip flexion 3 4-  Hip extension    Hip abduction 4- 4  Hip adduction    Hip internal rotation    Hip external rotation    Knee flexion 4 pain 4  Knee extension 4 pain 4+  Ankle dorsiflexion    Ankle plantarflexion    Ankle inversion    Ankle eversion     (Blank rows = not tested)  LUMBAR SPECIAL TESTS:  Slump test: Negative  FUNCTIONAL TESTS:  5 times sit to stand: 29.43 UE on legs Timed up and go (TUG): 14.96  11/06/2023 5 STS: 17.46 sec no UE support TUG: 9.62 sec  GAIT: Comments: Antalgic gait pattern; Decreased Lt LE step length  TODAY'S TREATMENT:  DATE:  11/19/2023 Nustep x 5 min level 4 Standing hamstring stretch 3 x 30 sec (extremely tight on right  hamstring) Standing quad/hip flexor stretch 3 x 30 sec (extremely tight on left quad) Trigger Point Dry-Needling  Treatment instructions: Expect mild to moderate muscle soreness. S/S of pneumothorax if dry needled over a lung field, and to seek immediate medical attention should they occur. Patient verbalized understanding of these instructions and education. Patient Consent Given: Yes Education handout provided: Yes Muscles treated: right lumbar multifidi, right glut max and min, right piriformis Electrical stimulation performed: No Parameters: N/A Treatment response/outcome: Skilled palpation used to identify taut bands and trigger points.  Once identified, dry needling techniques used to treat these areas.  Twitch response ellicited along with palpable elongation of muscle.  Following treatment, patient reported soreness but seemed to be less guarded and with decreased limping.     11/12/2023 TENS info Manual therapy: soft tissue mobilization to right gluteals and right lumbar musculature Trigger Point Dry-Needling  Treatment instructions: Expect mild to moderate muscle soreness. S/S of pneumothorax if dry needled over a lung field, and to seek immediate medical attention should they occur. Patient verbalized understanding of these instructions and education.  Patient Consent Given: Yes Education handout provided: Yes Muscles treated: sidelying right gluteals; prone right lumbar multifidi L1-L5 Electrical stimulation performed: No Parameters: N/A Treatment response/outcome: decreased tender point size and number and improved soft tissue mobility  Heat following DN 2 min Discussed ex's that would be easy to do during work day Seated QL stretch per HEP Standing QL stretch in doorway per HEP Seated piriformis stretch on edge of table per HEP   11/06/2023 NuStep level 4 7 minutes- PT present to discuss status 5 STS: 17.46 sec no UE support TUG: 9.62 sec 3 way SB stretch x 8 each  way Lower Trunk Rotation x 10 each way Supine hamstring stretch with green strap 5 x 10 sec holds each leg Supine adductor stretch with green strap 5 x 10 sec holds each leg Supine Unilateral ball press + knee extension x 10 each Supine TA contraction + Hip flexion x 10 each Hooklying ball press x 20  Standing Hip abduction 2.5# AW 2 x 10  Unilateral Farmers Carries 10# KB x 2 around gym Lateral trunk flexion 10# KB 2 x 10 each side ODI: 6/50 12%   11/01/2023 NuStep level 4 6 minutes- PT present to discuss status 3 way SB stretch x 8 each way Supine hamstring stretch with green strap 5 x 10 sec holds each leg Supine adductor stretch with green strap 5 x 10 sec holds each leg Supine LTR x 10 each Supine TA contraction + knee extension x 10 each Supine TA contraction + Hip flexion x 10 each Hip flexor stretch on 6 in stair x 30 sec- not intense enough Supine Modified thomas stretch on Rt 3 x 20 sec Supine Quad stretch with green strap 3 x 20sec Lateral trunk flexion 5# KB 2 x 10 each side Pallof Press red loop 2 x 10 each way Manual therapy to lumbar muscles for improved circulation. PT present to monitor patient's response.   10/25/2023 NuStep level 4 5 minutes- PT present to discuss status 3 way SB stretch x 8 each way Supine hamstring stretch with green strap 2 x 20-30 sec each leg Supine adductor stretch with green strap 2 x 20-30 sec each leg Supine LTR x 10 each Supine TA contraction + Bent knee fall out x 10 each  Supine TA contraction + Hip flexion x 10 each Lateral trunk flexion 5# KB 2 x 10 each side Unilateral Farmer's carries 10# KB x 4 Pallof Press red loop 2 x 10 each way Manual therapy to lumbar muscles for improved circulation. PT present to monitor patient's response.   PATIENT EDUCATION:  Education details: hip mobility; POC;  TENS info, DN info Person educated: Patient Education method: Explanation, Demonstration, and Handouts Education comprehension:  verbalized understanding, returned demonstration, and needs further education  HOME EXERCISE PROGRAM: Access Code: [email or web address] URL: [email or web address] Date: 11/12/2023 Prepared by: Lavinia Sharps  Exercises - Standing 'L' Stretch at Counter  - 1 x daily - 7 x weekly - 3 sets - 8 reps - Seated Piriformis Stretch with Trunk Bend  - 1 x daily - 7 x weekly - 3 sets - 8 reps - 20 hold - Side Lunge with Counter Support  - 1 x daily - 7 x weekly - 3 sets - 8 reps - 20 hold - Seated Hamstring Stretch  - 1 x daily - 7 x weekly - 3 sets - 8 reps - 20 hold - Supine Lower Trunk Rotation  - 1 x daily - 7 x weekly - 1 sets - 10 reps - 1-2 hold - Supine Posterior Pelvic Tilt  - 1 x daily - 7 x weekly - 2 sets - 10 reps - 4 hold - Standing Quadratus Lumborum Stretch with Doorway (Mirrored)  - 1 x daily - 7 x weekly - 1 sets - 20 hold - Seated Table Piriformis Stretch  - 1 x daily - 7 x weekly - 1 sets - 3 reps - 20 hold - Seated Quadratus Lumborum Stretch in Chair  - 1 x daily - 7 x weekly - 1 sets - 3 reps - 20 hold  ASSESSMENT:  CLINICAL IMPRESSION: Renee [name] is progressing appropriately and is benefiting from the DN.  She has extremely tight right hamstring and extremely tight left quad which is likely affecting her pelvic symmetry and her back pain.  She also pinpoints some extreme groin pain upon attempting standing hamstring stretching.  She states this has been there for a while and feels like it goes all the way around the hip joint.  She has not had any imaging of the right hip.  Suggested she consult her provider about this if the hip pain persists.    May add right hip flexors and left lumbar multifidi to DN next time.  Suggested patient try the standing stretches at home.  She would benefit from continued skilled PT for hip mobility, core strength and modalities along with DN for pain relief.     OBJECTIVE IMPAIRMENTS: Abnormal gait, decreased mobility, difficulty walking, decreased  ROM, decreased strength, increased muscle spasms, and impaired flexibility.   ACTIVITY LIMITATIONS: carrying, bending, sitting, standing, sleeping, and stairs  PARTICIPATION LIMITATIONS: driving, shopping, and community activity  PERSONAL FACTORS: Fitness are also affecting patient's functional outcome.   REHAB POTENTIAL: Good  CLINICAL DECISION MAKING: Stable/uncomplicated  EVALUATION COMPLEXITY: Low   GOALS: Goals reviewed with patient? Yes  SHORT TERM GOALS: Target date: 10/14/2023 Patient will be independent with initial HEP. Baseline:  Goal status: MET 10/11/2023  2.  Patient will report > or = to 25% improvement in walking tolerance. Baseline: 5 minutes Goal status: MET 10/11/2023 Patient reports 80% improvement  3. Patient will be able to stand for > or = to 15 minutes without increase back and Rt LE pain. Baseline:10 minutes Goal status: MET  10/11/2023   LONG TERM GOALS: Target date: 12/18/2023 Patient will demonstrate independence in advanced HEP. Baseline:  Goal status: IN PROGRESS 11/06/2023  2.  Patient will report > or = to 50% improvement in walking tolerance. Baseline: 5 minutes Goal status: MET 15 minutes 11/06/2023  3. Patient Oswestry Score will be < or = to 12 for decreased perceived disability. Baseline: 22/50 Goal status: MET 11/06/2023 6/50  4.Patient will be able to stand for > or = to 20 minutes without increase back and Rt LE pain. Baseline: 10 minutes Goal status: MET 10/18/2023  5. Patient will be able to complete a full work day with < or = to 2/10 back pain Baseline:  Goal status: NEW  6. Patient will be able to bend and pick up objects with < or = to 2/10 back pain Baseline:  Goal status: NEW PLAN:  PT FREQUENCY: 1x/week  PT DURATION: 6 weeks  PLANNED INTERVENTIONS: Therapeutic exercises, Therapeutic activity, Neuromuscular re-education, Balance training, Gait training, Patient/Family education, Self Care, Joint mobilization,  Joint manipulation, Stair training, Vestibular training, Canalith repositioning, Aquatic Therapy, Dry Needling, Electrical stimulation, Spinal manipulation, Spinal mobilization, Cryotherapy, Moist heat, Compression bandaging, scar mobilization, Splintting, Taping, Vasopneumatic device, Traction, Ultrasound, Biofeedback, Ionotophoresis 4mg /ml Dexamethasone, and Manual therapy.  PLAN FOR NEXT SESSION: Emphasis on hip mobility and stretching, assess response to  DN #2 and possibly add DN to left lumbar multifidi and right hip flexor; continue progressing core exercises;  update POC to include core exercises  Renee [name], PT 11/19/23 9:32 AM [facility] [address] [place], [place] [address] Phone # [phone] Fax [phone]

## 2023-11-26 ENCOUNTER — Telehealth: Payer: Self-pay | Admitting: Physical Therapy

## 2023-11-26 ENCOUNTER — Ambulatory Visit: Payer: Medicaid Other | Attending: Family Medicine | Admitting: Physical Therapy

## 2023-11-26 DIAGNOSIS — M5459 Other low back pain: Secondary | ICD-10-CM | POA: Insufficient documentation

## 2023-11-26 DIAGNOSIS — M62838 Other muscle spasm: Secondary | ICD-10-CM | POA: Insufficient documentation

## 2023-11-26 NOTE — Telephone Encounter (Signed)
Called regarding missed appt this morning.  With the icy weather her kids are home from school and the appt slipped her mind.  She will be here for next appt on 12/10

## 2023-12-03 ENCOUNTER — Ambulatory Visit: Payer: Medicaid Other

## 2023-12-10 ENCOUNTER — Ambulatory Visit: Payer: Medicaid Other | Admitting: Physical Therapy

## 2023-12-10 DIAGNOSIS — M62838 Other muscle spasm: Secondary | ICD-10-CM | POA: Diagnosis present

## 2023-12-10 DIAGNOSIS — M5459 Other low back pain: Secondary | ICD-10-CM | POA: Diagnosis present

## 2023-12-10 NOTE — Therapy (Addendum)
 OUTPATIENT PHYSICAL THERAPY THORACOLUMBAR TREATMENT PHYSICAL THERAPY DISCHARGE SUMMARY  Visits from Start of Care: 11  Current functional level related to goals / functional outcomes: See below   Remaining deficits: See below   Education / Equipment: See below   Patient agrees to discharge. Patient goals were partially met. Patient is being discharged due to not returning since the last visit.     Patient Name: Renee [name] MRN: [id number] DOB:Jan 09, 1976, 47 y.o., female Today's Date: 12/10/2023  END OF SESSION:  PT End of Session - 12/10/23 0853     Visit Number 11    Number of Visits 27    Date for PT Re-Evaluation 12/18/23    Authorization Type [place] MEDICA[employer]- [facility]; shara req at 27 visits    PT Start Time [phone]   late   PT Stop Time 0930    PT Time Calculation (min) 38 min    Activity Tolerance Patient tolerated treatment well                    Past Medical History:  Diagnosis Date   Anemia    No pertinent past medical history    Pelvic pain in female    Past Surgical History:  Procedure Laterality Date   BILATERAL SALPINGECTOMY N/A 08/04/2015   Procedure: LEFT SALPINGECTOMY;  Surgeon: Duwaine Blumenthal, DO;  Location: [facility];  Service: Gynecology;  Laterality: N/A;   CESAREAN SECTION     ENDOMETRIAL ABLATION     LAPAROSCOPIC ASSISTED VAGINAL HYSTERECTOMY N/A 08/04/2015   Procedure: LAPAROSCOPIC ASSISTED VAGINAL HYSTERECTOMY;  Surgeon: Duwaine Blumenthal, DO;  Location: [facility];  Service: Gynecology;  Laterality: N/A;   LAPAROSCOPIC LYSIS OF ADHESIONS N/A 08/04/2015   Procedure: LAPAROSCOPIC LYSIS OF ADHESIONS;  Surgeon: Duwaine Blumenthal, DO;  Location: [facility];  Service: Gynecology;  Laterality: N/A;   TUBAL LIGATION     WISDOM TOOTH EXTRACTION     Patient Active Problem List   Diagnosis Date Noted   Obesity, morbid, BMI 50 or higher (HCC) 08/15/2020   Mild sleep apnea 01/19/2020   S/P hysterectomy 08/04/2015   Pelvic pain in female 11/17/2012     REFERRING PROVIDER: Joane Artist RAMAN, MDCorey, Artist RAMAN, MD  REFERRING DIAG: M54.16 (ICD-10-CM) - Lumbar radiculitis M25.551 (ICD-10-CM) - Pain of right hip  Rationale for Evaluation and Treatment: Rehabilitation  THERAPY DIAG:  Other low back pain  Other muscle spasm  ONSET DATE: 09/07/23  SUBJECTIVE:  SUBJECTIVE STATEMENT: Working more now.  The DN has really helped. A little sore but handles it well. Presents with waddling gait pattern/antalgia.    From eval: Patient present to therapy with Rt sided lumbar back pain that started two weeks ago. She woke up with numbness in her Rt hip flexor area and shooting pain into her quad. Patient increased her activity 2 weeks before this occurred ; she was walking 1.5 miles/day. She notices her leg will give away when she stands up after sitting for a long time. She has burning in her lateral Rt hip and numbness hip flexor area occasionally . Patient has not been able to sleep at night; she is normally a side sleeper. While working patient has to take seated rest breaks due to pain.  PERTINENT HISTORY:  None  PAIN: 11/06/2023 Are you having pain? Yes: NPRS scale: 5/10 Pain location:  right low back, hip a little to the left Pain description: Achy; sharp; shooting; dull Aggravating factors: Laying down in any position; driving; standing > 89-84fpwd; Bending Relieving factors: Medication, massage gun   PRECAUTIONS: None  RED FLAGS: None   WEIGHT BEARING RESTRICTIONS: No  FALLS:  Has patient fallen in last 6 months? 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 Right subarticular disc protrusion at L5-S1, contacting and minimally displacing the descending right S1 nerve root in the right lateral recess. 2. Small right foraminal disc protrusion at L3-4, closely approximating and potentially irritating the exiting right L3 nerve root. 3. Small central to left subarticular disc protrusion at L4-5 with resultant mild spinal stenosis.  PATIENT SURVEYS:  Modified Oswestry 22/50; 44% perceived disability  11/06/2023 : 6/50 12% perceived disability  SCREENING FOR RED FLAGS: Bowel or bladder incontinence: No Spinal tumors: No Cauda equina syndrome: No Compression fracture: No Abdominal aneurysm: No  COGNITION: Overall cognitive status: Within functional limits for tasks assessed     POSTURE: anterior pelvic tilt  PALPATION: Palpable taut bands of Rt piriformis and Rt erector spinae  LUMBAR ROM:   AROM eval  Flexion WFL  Extension 60% limited  Right lateral flexion Above knee joint line  Left lateral flexion Bend to knee joint line  Right rotation WFL  Left rotation 60% limited   (Blank rows = not tested)   LOWER EXTREMITY MMT:    MMT Right eval Left eval  Hip flexion 3 4-  Hip extension    Hip abduction 4- 4  Hip adduction    Hip internal rotation    Hip external rotation    Knee flexion 4 pain 4  Knee extension 4 pain 4+  Ankle dorsiflexion    Ankle plantarflexion    Ankle inversion    Ankle eversion     (Blank rows = not tested)  LUMBAR SPECIAL TESTS:  Slump test: Negative  FUNCTIONAL TESTS:  5 times sit to stand: 29.43 UE on legs Timed up and go (TUG): 14.96  11/06/2023 5 STS: 17.46 sec no UE support TUG: 9.62 sec  GAIT: Comments: Antalgic gait pattern; Decreased Lt LE step length  TODAY'S TREATMENT:  DATE:  12/10/2023 Review of left hip flexor stretch from  HEP Supine piriformis stretch with green ball  (added to HEP) Supine flexion lumbar stretch with ball  (added to HEP) Seated lumbar stretch with ball 3 ways 10x  (added to HEP) Trigger Point Dry-Needling  Treatment instructions: Expect mild to moderate muscle soreness. S/S of pneumothorax if dry needled over a lung field, and to seek immediate medical attention should they occur. Patient verbalized understanding of these instructions and education. Patient Consent Given: Yes Education handout provided: Yes Muscles treated: bil lumbar multifidi, right glut max and min, right piriformis; left hip abductor/flexor in sidelying Electrical stimulation performed: yes bil multifidi and right gluteals Parameters: 1.5 ma 8 min Treatment response/outcome: Skilled palpation used to identify taut bands and trigger points.  Once identified, dry needling techniques used to treat these areas.  Twitch response ellicited along with palpable elongation of muscle.  Following treatment, patient reported soreness but seemed to be less guarded and with decreased limping.    11/19/2023 Nustep x 5 min level 4 Standing hamstring stretch 3 x 30 sec (extremely tight on right hamstring) Standing quad/hip flexor stretch 3 x 30 sec (extremely tight on left quad) Trigger Point Dry-Needling  Treatment instructions: Expect mild to moderate muscle soreness. S/S of pneumothorax if dry needled over a lung field, and to seek immediate medical attention should they occur. Patient verbalized understanding of these instructions and education. Patient Consent Given: Yes Education handout provided: Yes Muscles treated: right lumbar multifidi, right glut max and min, right piriformis Electrical stimulation performed: No Parameters: N/A Treatment response/outcome: Skilled palpation used to identify taut bands and trigger points.  Once identified, dry needling techniques used to treat these areas.  Twitch response ellicited along with  palpable elongation of muscle.  Following treatment, patient reported soreness but seemed to be less guarded and with decreased limping.     11/12/2023 TENS info Manual therapy: soft tissue mobilization to right gluteals and right lumbar musculature Trigger Point Dry-Needling  Treatment instructions: Expect mild to moderate muscle soreness. S/S of pneumothorax if dry needled over a lung field, and to seek immediate medical attention should they occur. Patient verbalized understanding of these instructions and education.  Patient Consent Given: Yes Education handout provided: Yes Muscles treated: sidelying right gluteals; prone right lumbar multifidi L1-L5 Electrical stimulation performed: No Parameters: N/A Treatment response/outcome: decreased tender point size and number and improved soft tissue mobility  Heat following DN 2 min Discussed ex's that would be easy to do during work day Seated QL stretch per HEP Standing QL stretch in doorway per HEP Seated piriformis stretch on edge of table per HEP   11/06/2023 NuStep level 4 7 minutes- PT present to discuss status 5 STS: 17.46 sec no UE support TUG: 9.62 sec 3 way SB stretch x 8 each way Lower Trunk Rotation x 10 each way Supine hamstring stretch with green strap 5 x 10 sec holds each leg Supine adductor stretch with green strap 5 x 10 sec holds each leg Supine Unilateral ball press + knee extension x 10 each Supine TA contraction + Hip flexion x 10 each Hooklying ball press x 20  Standing Hip abduction 2.5# AW 2 x 10  Unilateral [employer] 10# KB x 2 around gym Lateral trunk flexion 10# KB 2 x 10 each side ODI: 6/50 12%   11/01/2023 NuStep level 4 6 minutes- PT present to discuss status 3 way SB stretch x 8 each way Supine hamstring stretch with  green strap 5 x 10 sec holds each leg Supine adductor stretch with green strap 5 x 10 sec holds each leg Supine LTR x 10 each Supine TA contraction + knee extension x 10  each Supine TA contraction + Hip flexion x 10 each Hip flexor stretch on 6 in stair x 30 sec- not intense enough Supine Modified thomas stretch on Rt 3 x 20 sec Supine Quad stretch with green strap 3 x 20sec Lateral trunk flexion 5# KB 2 x 10 each side Pallof Press red loop 2 x 10 each way Manual therapy to lumbar muscles for improved circulation. PT present to monitor patient's response.    PATIENT EDUCATION:  Education details: hip mobility; POC;  TENS info, DN info Person educated: Patient Education method: Explanation, Demonstration, and Handouts Education comprehension: verbalized understanding, returned demonstration, and needs further education  HOME EXERCISE PROGRAM: Access Code: [email or web address] URL: [email or web address] Date: 12/10/2023 Prepared by: Glade Pesa  Exercises - Standing 'L' Stretch at Counter  - 1 x daily - 7 x weekly - 3 sets - 8 reps - Seated Piriformis Stretch with Trunk Bend  - 1 x daily - 7 x weekly - 3 sets - 8 reps - 20 hold - Side Lunge with Counter Support  - 1 x daily - 7 x weekly - 3 sets - 8 reps - 20 hold - Seated Hamstring Stretch  - 1 x daily - 7 x weekly - 3 sets - 8 reps - 20 hold - Supine Lower Trunk Rotation  - 1 x daily - 7 x weekly - 1 sets - 10 reps - 1-2 hold - Supine Posterior Pelvic Tilt  - 1 x daily - 7 x weekly - 2 sets - 10 reps - 4 hold - Standing Quadratus Lumborum Stretch with Doorway (Mirrored)  - 1 x daily - 7 x weekly - 1 sets - 20 hold - Seated Table Piriformis Stretch  - 1 x daily - 7 x weekly - 1 sets - 3 reps - 20 hold - Seated Quadratus Lumborum Stretch in Chair  - 1 x daily - 7 x weekly - 1 sets - 3 reps - 20 hold - Supine Piriformis Stretch with Swiss Ball  - 1 x daily - 7 x weekly - 1 sets - 3 reps - Seated Flexion Stretch with Swiss Ball  - 1 x daily - 7 x weekly - 1 sets - 10 reps - Supine Lumbar Rotation with Swiss Ball  - 1 x daily - 7 x weekly - 1 sets - 10 reps  ASSESSMENT:  CLINICAL  IMPRESSION: Arrives with significant waddling and antalgia with transitional movements particularly with moving on/off the table and rolling to sidelying. 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  Electrical stimulation added to indwelling needles for further impacts on pain relief and neuromuscular changes as well as skeletal muscle pump. Therapist monitoring response to all interventions and modifying treatment accordingly.    OBJECTIVE IMPAIRMENTS: Abnormal gait, decreased mobility, difficulty walking, decreased ROM, decreased strength, increased muscle spasms, and impaired flexibility.   ACTIVITY LIMITATIONS: carrying, bending, sitting, standing, sleeping, and stairs  PARTICIPATION LIMITATIONS: driving, shopping, and community activity  PERSONAL FACTORS: Fitness are also affecting patient's functional outcome.   REHAB POTENTIAL: Good  CLINICAL DECISION MAKING: Stable/uncomplicated  EVALUATION COMPLEXITY: Low   GOALS: Goals reviewed with patient? Yes  SHORT TERM GOALS: Target date: 10/14/2023 Patient will be independent  with initial HEP. Baseline:  Goal status: MET 10/11/2023  2.  Patient will report > or = to 25% improvement in walking tolerance. Baseline: 5 minutes Goal status: MET 10/11/2023 Patient reports 80% improvement  3. Patient will be able to stand for > or = to 15 minutes without increase back and Rt LE pain. Baseline:10 minutes Goal status: MET 10/11/2023   LONG TERM GOALS: Target date: 12/18/2023 Patient will demonstrate independence in advanced HEP. Baseline:  Goal status: IN PROGRESS 11/06/2023  2.  Patient will report > or = to 50% improvement in walking tolerance. Baseline: 5 minutes Goal status: MET 15 minutes 11/06/2023  3. Patient Oswestry Score will be < or = to 12 for decreased perceived disability. Baseline: 22/50 Goal status: MET 11/06/2023  6/50  4.Patient will be able to stand for > or = to 20 minutes without increase back and Rt LE pain. Baseline: 10 minutes Goal status: MET 10/18/2023  5. Patient will be able to complete a full work day with < or = to 2/10 back pain Baseline:  Goal status: NEW  6. Patient will be able to bend and pick up objects with < or = to 2/10 back pain Baseline:  Goal status: NEW PLAN:  PT FREQUENCY: 1x/week  PT DURATION: 6 weeks  PLANNED INTERVENTIONS: Therapeutic exercises, Therapeutic activity, Neuromuscular re-education, Balance training, Gait training, Patient/Family education, Self Care, Joint mobilization, Joint manipulation, Stair training, Vestibular training, Canalith repositioning, Aquatic Therapy, Dry Needling, Electrical stimulation, Spinal manipulation, Spinal mobilization, Cryotherapy, Moist heat, Compression bandaging, scar mobilization, Splintting, Taping, Vasopneumatic device, Traction, Ultrasound, Biofeedback, Ionotophoresis 4mg /ml Dexamethasone , and Manual therapy.  PLAN FOR NEXT SESSION: check objective measures and progress toward goals to determine if further PT would be beneficial; Emphasis on hip mobility and stretching, assess response to  DN with added ES; continue progressing core exercises;  update POC to include core exercises  Jennifer B. Fields, PT 10/07/24 9:08 AM  Glade Pesa, PT 12/10/23 3:02 PM Phone: [phone] Fax: [phone]

## 2023-12-16 ENCOUNTER — Ambulatory Visit: Payer: Medicaid Other

## 2024-01-17 ENCOUNTER — Emergency Department (HOSPITAL_BASED_OUTPATIENT_CLINIC_OR_DEPARTMENT_OTHER)
Admission: EM | Admit: 2024-01-17 | Discharge: 2024-01-17 | Disposition: A | Discharge status: Home / Self Care | Payer: Medicaid Other | Attending: Emergency Medicine | Admitting: Emergency Medicine

## 2024-01-17 ENCOUNTER — Emergency Department (HOSPITAL_BASED_OUTPATIENT_CLINIC_OR_DEPARTMENT_OTHER): Payer: Medicaid Other

## 2024-01-17 ENCOUNTER — Other Ambulatory Visit: Payer: Self-pay

## 2024-01-17 DIAGNOSIS — R040 Epistaxis: Secondary | ICD-10-CM | POA: Insufficient documentation

## 2024-01-17 DIAGNOSIS — R519 Headache, unspecified: Secondary | ICD-10-CM | POA: Insufficient documentation

## 2024-01-17 DIAGNOSIS — Z79899 Other long term (current) drug therapy: Secondary | ICD-10-CM | POA: Insufficient documentation

## 2024-01-17 MED ORDER — PROCHLORPERAZINE EDISYLATE 10 MG/2ML IJ SOLN
10.0000 mg | Freq: Once | INTRAMUSCULAR | Status: AC
  Administered 2024-01-17: 10 mg via INTRAVENOUS
  Filled 2024-01-17: qty 2

## 2024-01-17 MED ORDER — DIPHENHYDRAMINE HCL 50 MG/ML IJ SOLN
12.5000 mg | Freq: Once | INTRAMUSCULAR | Status: AC
  Administered 2024-01-17: 12.5 mg via INTRAVENOUS
  Filled 2024-01-17: qty 1

## 2024-01-17 MED ORDER — ACETAMINOPHEN 500 MG PO TABS
1000.0000 mg | ORAL_TABLET | Freq: Once | ORAL | Status: AC
  Administered 2024-01-17: 1000 mg via ORAL
  Filled 2024-01-17: qty 2

## 2024-01-17 MED ORDER — DEXAMETHASONE SODIUM PHOSPHATE 10 MG/ML IJ SOLN
10.0000 mg | Freq: Once | INTRAMUSCULAR | Status: AC
  Administered 2024-01-17: 10 mg via INTRAVENOUS
  Filled 2024-01-17: qty 1

## 2024-01-17 NOTE — Discharge Instructions (Addendum)
It was a pleasure taking care of you here in the emergency department  CT scan was reassuring  Make sure to rest, hydrate at home  With regards to your nosebleed I would recommend cool-mist humidifier in the room where you sleep as sometimes heat can dry out the sinuses and cause nosebleeds.  May also use over-the-counter AYR gel to your nostrils.  May also use Afrin which helps to shrink the blood vessels in the nose to stop the bleed  Make sure to follow-up outpatient, return for new or worsening symptoms.

## 2024-01-17 NOTE — ED Triage Notes (Signed)
A few instances of epistaxis today. Accompanied by HA. No thinners.

## 2024-01-17 NOTE — ED Provider Notes (Signed)
[place] EMERGENCY DEPARTMENT AT [facility] Provider Note   CSN: [id number] Arrival date & time: 01/17/24  2205    History  Headache, epistaxis   Renee [name] is a 48 y.o. female here for evaluation of headache.  Developed headache earlier today.  Generalized in nature.  No sudden onset headache.  No numbness, weakness.  She has had some chronic blurring to her vision however no new vision changes.  No neck pain, fever.  No difficulty with word finding.  No recent injury or trauma.  She is also noted some intermittent epistaxis, 3 episodes today.  Resolved with pressure.  No anticoagulation use.  No history of similar.  No cough, congestion, rhinorrhea, speech change, abdominal pain, nausea or vomiting.  No light sensitivity.  HPI     Home Medications Prior to Admission medications   Medication Sig Start Date End Date Taking? Authorizing Provider  cyclobenzaprine (FLEXERIL) 10 MG tablet Take 1 tablet (10 mg total) by mouth 3 (three) times daily as needed for muscle spasms. 02/03/17   Hayden Rasmussen, NP  HYDROcodone-acetaminophen (NORCO/VICODIN) 5-325 MG tablet Take 1 tablet by mouth every 6 (six) hours as needed. 09/17/23   Rodolph Bong, MD  LORazepam (ATIVAN) 0.5 MG tablet 1-2 tabs 30 - 60 min prior to MRI. Do not drive with this medicine. 09/13/23   Rodolph Bong, MD  naproxen (NAPROSYN) 500 MG tablet Take 1 tablet (500 mg total) by mouth 2 (two) times daily. Patient not taking: Reported on 09/16/2023 12/09/18   Petrucelli, Pleas Koch, PA-C  oxyCODONE-acetaminophen (PERCOCET/ROXICET) 5-325 MG tablet Take 1 tablet by mouth every 6 (six) hours as needed for severe pain. 09/19/23   Honor Loh M, PA-C  predniSONE (DELTASONE) 20 MG tablet Take 2 tablets (40 mg total) by mouth daily. 09/19/23   Honor Loh M, PA-C  predniSONE (DELTASONE) 50 MG tablet Take 1 tablet (50 mg total) by mouth daily. 09/13/23   Rodolph Bong, MD  pregabalin (LYRICA) 75 MG capsule Take 1 capsule (75  mg total) by mouth 2 (two) times daily as needed (nerve pain). 09/17/23   Rodolph Bong, MD  valACYclovir (VALTREX) 1000 MG tablet Take 1 tablet (1,000 mg total) by mouth 3 (three) times daily. 09/17/23   Rodolph Bong, MD      Allergies    Shellfish allergy and Morphine and codeine    Review of Systems   Review of Systems  Constitutional: Negative.   HENT:  Positive for nosebleeds.   Respiratory: Negative.    Cardiovascular: Negative.   Gastrointestinal: Negative.   Genitourinary: Negative.   Musculoskeletal: Negative.   Neurological:  Positive for headaches.  All other systems reviewed and are negative.   Physical Exam Updated Vital Signs BP (!) 142/98   Pulse (!) 103   Temp 98.5 F (36.9 C)   LMP 07/25/2015 (Exact Date)   SpO2 100%  Physical Exam Physical Exam  Constitutional: Pt appears well-developed and well-nourished. No distress.  HENT:  Head: Normocephalic and atraumatic.  Mouth/Throat: Oropharynx is clear and moist.  Eyes: Conjunctivae and EOM are normal. Pupils are equal, round, and reactive to light. No scleral icterus.  No horizontal, vertical or rotational nystagmus  Neck: Normal range of motion. Neck supple.  Nose: Dried blood bilateral nares, no active bleeding Full active and passive ROM without pain No midline or paraspinal tenderness No nuchal rigidity or meningeal signs  Cardiovascular: Normal rate, regular rhythm and intact distal pulses.   Pulmonary/Chest: Effort normal  and breath sounds normal. No respiratory distress.  Abdominal: Soft. Bowel sounds are normal. There is no tenderness. Musculoskeletal: Normal range of motion.  Lymphadenopathy:    No cervical adenopathy.  Neurological: Pt. is alert and oriented to person, place, and time.  Mental Status:  Alert, oriented, thought content appropriate. Speech fluent without evidence of aphasia. Able to follow 2 step commands without difficulty.  Cranial Nerves:  2-12 grossly intact Motor:  Equal  strength BIL Sensory: Intact sensation Cerebellar: normal F2N Gait: normal gait and balance CV: distal pulses palpable throughout   Skin: Skin is warm and dry. No rash noted. Pt is not diaphoretic.  Psychiatric: Tearful in room Nursing note and vitals reviewed.  ED Results / Procedures / Treatments   Labs (all labs ordered are listed, but only abnormal results are displayed) Labs Reviewed - No data to display  EKG None  Radiology CT Head Wo Contrast Result Date: 01/17/2024 CLINICAL DATA:  Headache, increasing frequency or severity EXAM: CT HEAD WITHOUT CONTRAST TECHNIQUE: Contiguous axial images were obtained from the base of the skull through the vertex without intravenous contrast. RADIATION DOSE REDUCTION: This exam was performed according to the departmental dose-optimization program which includes automated exposure control, adjustment of the mA and/or kV according to patient size and/or use of iterative reconstruction technique. COMPARISON:  CT head 01/16/2023 FINDINGS: Brain: No evidence of large-territorial acute infarction. No parenchymal hemorrhage. No mass lesion. No extra-axial collection. No mass effect or midline shift. No hydrocephalus. Basilar cisterns are patent. Vascular: No hyperdense vessel. Atherosclerotic calcifications are present within the cavernous internal carotid arteries. Skull: No acute fracture or focal lesion. Sinuses/Orbits: Paranasal sinuses and mastoid air cells are clear. The orbits are unremarkable. Other: None. IMPRESSION: No acute intracranial abnormality. Electronically Signed   By: Tish Frederickson M.D.   On: 01/17/2024 23:23    Procedures Procedures    Medications Ordered in ED Medications  diphenhydrAMINE (BENADRYL) injection 12.5 mg (12.5 mg Intravenous Given 01/17/24 2259)  prochlorperazine (COMPAZINE) injection 10 mg (10 mg Intravenous Given 01/17/24 2249)  dexamethasone (DECADRON) injection 10 mg (10 mg Intravenous Given 01/17/24 2253)   acetaminophen (TYLENOL) tablet 1,000 mg (1,000 mg Oral Given 01/17/24 2247)   ED Course/ Medical Decision Making/ A&P   48 year old here for evaluation of headache and epistaxis.  Has had a headache all day today.  She has a nonfocal neuroexam without deficits.  No sudden onset thunderclap headache.  No recent head trauma.  She has had some mild episodes of epistaxis, no active bleeding.  She is tearful in room, has a difficult time describing her symptoms.  Will plan on migraine cocktail, mildly hypertensive.   Imaging personally viewed and interpreted: CT head without significant abnormality  Patient reassessed. HA improved.  Will have her FU outpatient.  The patient has been appropriately medically screened and/or stabilized in the ED. I have low suspicion for any other emergent medical condition which would require further screening, evaluation or treatment in the ED or require inpatient management.  Patient is hemodynamically stable and in no acute distress.  Patient able to ambulate in department prior to ED.  Evaluation does not show acute pathology that would require ongoing or additional emergent interventions while in the emergency department or further inpatient treatment.  I have discussed the diagnosis with the patient and answered all questions.  Pain is been managed while in the emergency department and patient has no further complaints prior to discharge.  Patient is comfortable with plan discussed in room  and is stable for discharge at this time.  I have discussed strict return precautions for returning to the emergency department.  Patient was encouraged to follow-up with PCP/specialist refer to at discharge.                                Medical Decision Making Amount and/or Complexity of Data Reviewed External Data Reviewed: labs, radiology and notes. Radiology: ordered and independent interpretation performed. Decision-making details documented in ED Course.  Risk OTC  drugs. Prescription drug management.         Final Clinical Impression(s) / ED Diagnoses Final diagnoses:  Acute nonintractable headache, unspecified headache type  Epistaxis    Rx / DC Orders ED Discharge Orders     None         Kathye Cipriani A, PA-C 01/17/24 2333    Laurence Spates, MD 01/18/24 1248
